# Patient Record
Sex: Female | Born: 1935 | Race: White | Hispanic: No | Smoking: Never smoker
Health system: Southern US, Community
[De-identification: ages and names within clinical notes are randomized; demographics above are authoritative.]

## PROBLEM LIST (undated history)

## (undated) DIAGNOSIS — T7840XA Allergy, unspecified, initial encounter: Secondary | ICD-10-CM

## (undated) DIAGNOSIS — I10 Essential (primary) hypertension: Secondary | ICD-10-CM

## (undated) DIAGNOSIS — E8881 Metabolic syndrome: Secondary | ICD-10-CM

## (undated) DIAGNOSIS — E785 Hyperlipidemia, unspecified: Secondary | ICD-10-CM

## (undated) DIAGNOSIS — M810 Age-related osteoporosis without current pathological fracture: Secondary | ICD-10-CM

## (undated) DIAGNOSIS — E559 Vitamin D deficiency, unspecified: Secondary | ICD-10-CM

## (undated) DIAGNOSIS — F5104 Psychophysiologic insomnia: Secondary | ICD-10-CM

## (undated) DIAGNOSIS — B0229 Other postherpetic nervous system involvement: Secondary | ICD-10-CM

## (undated) DIAGNOSIS — K589 Irritable bowel syndrome without diarrhea: Secondary | ICD-10-CM

## (undated) DIAGNOSIS — N393 Stress incontinence (female) (male): Secondary | ICD-10-CM

## (undated) DIAGNOSIS — L819 Disorder of pigmentation, unspecified: Secondary | ICD-10-CM

## (undated) DIAGNOSIS — K900 Celiac disease: Secondary | ICD-10-CM

## (undated) DIAGNOSIS — R002 Palpitations: Secondary | ICD-10-CM

## (undated) HISTORY — PX: APPENDECTOMY: SHX54

## (undated) HISTORY — DX: Stress incontinence (female) (male): N39.3

## (undated) HISTORY — DX: Other postherpetic nervous system involvement: B02.29

## (undated) HISTORY — DX: Vitamin D deficiency, unspecified: E55.9

## (undated) HISTORY — DX: Palpitations: R00.2

## (undated) HISTORY — DX: Irritable bowel syndrome, unspecified: K58.9

## (undated) HISTORY — PX: OTHER SURGICAL HISTORY: SHX169

## (undated) HISTORY — DX: Metabolic syndrome: E88.810

## (undated) HISTORY — DX: Hyperlipidemia, unspecified: E78.5

## (undated) HISTORY — DX: Celiac disease: K90.0

## (undated) HISTORY — DX: Psychophysiologic insomnia: F51.04

## (undated) HISTORY — PX: TUBAL LIGATION: SHX77

## (undated) HISTORY — DX: Metabolic syndrome: E88.81

## (undated) HISTORY — DX: Essential (primary) hypertension: I10

## (undated) HISTORY — DX: Disorder of pigmentation, unspecified: L81.9

## (undated) HISTORY — DX: Age-related osteoporosis without current pathological fracture: M81.0

## (undated) HISTORY — DX: Allergy, unspecified, initial encounter: T78.40XA

---

## 1973-10-18 HISTORY — PX: ABDOMINAL HYSTERECTOMY: SHX81

## 2005-05-03 ENCOUNTER — Ambulatory Visit: Payer: Self-pay | Admitting: Neurology

## 2005-10-18 LAB — HM COLONOSCOPY: HM Colonoscopy: NORMAL

## 2006-01-28 ENCOUNTER — Ambulatory Visit: Payer: Self-pay | Admitting: General Practice

## 2007-02-25 ENCOUNTER — Other Ambulatory Visit: Payer: Self-pay

## 2007-02-25 ENCOUNTER — Ambulatory Visit: Payer: Self-pay | Admitting: Emergency Medicine

## 2007-06-27 ENCOUNTER — Ambulatory Visit: Payer: Self-pay | Admitting: Family Medicine

## 2008-10-16 ENCOUNTER — Ambulatory Visit: Payer: Self-pay | Admitting: Family Medicine

## 2008-10-23 ENCOUNTER — Ambulatory Visit: Payer: Self-pay | Admitting: Family Medicine

## 2009-01-07 ENCOUNTER — Ambulatory Visit: Payer: Self-pay | Admitting: Family Medicine

## 2009-03-31 ENCOUNTER — Ambulatory Visit: Payer: Self-pay | Admitting: Internal Medicine

## 2011-02-11 LAB — HM MAMMOGRAPHY: HM Mammogram: NORMAL

## 2011-03-23 ENCOUNTER — Ambulatory Visit: Payer: Self-pay | Admitting: Family Medicine

## 2011-04-12 ENCOUNTER — Ambulatory Visit: Payer: Self-pay

## 2011-06-18 ENCOUNTER — Emergency Department: Payer: Self-pay | Admitting: Internal Medicine

## 2012-03-08 ENCOUNTER — Ambulatory Visit: Payer: Self-pay | Admitting: Family Medicine

## 2012-03-08 LAB — HM DEXA SCAN

## 2012-09-04 ENCOUNTER — Ambulatory Visit: Payer: Self-pay | Admitting: Family Medicine

## 2012-10-31 IMAGING — CR DG LUMBAR SPINE 2-3V
1 series · 3 of 3 positions shown · non-contrast
Comparison: none

REASON FOR EXAM: thoracic pain and lumbago
COMMENTS:

PROCEDURE:     KDR - KDXR LUMBAR SPINE AP AND LATERAL  - March 23, 2011  [DATE]
RESULT:     Comparison: None

[Series 1: view not recorded · 0.17mm/px · 3 of 3 slices shown]
[im 1/3]
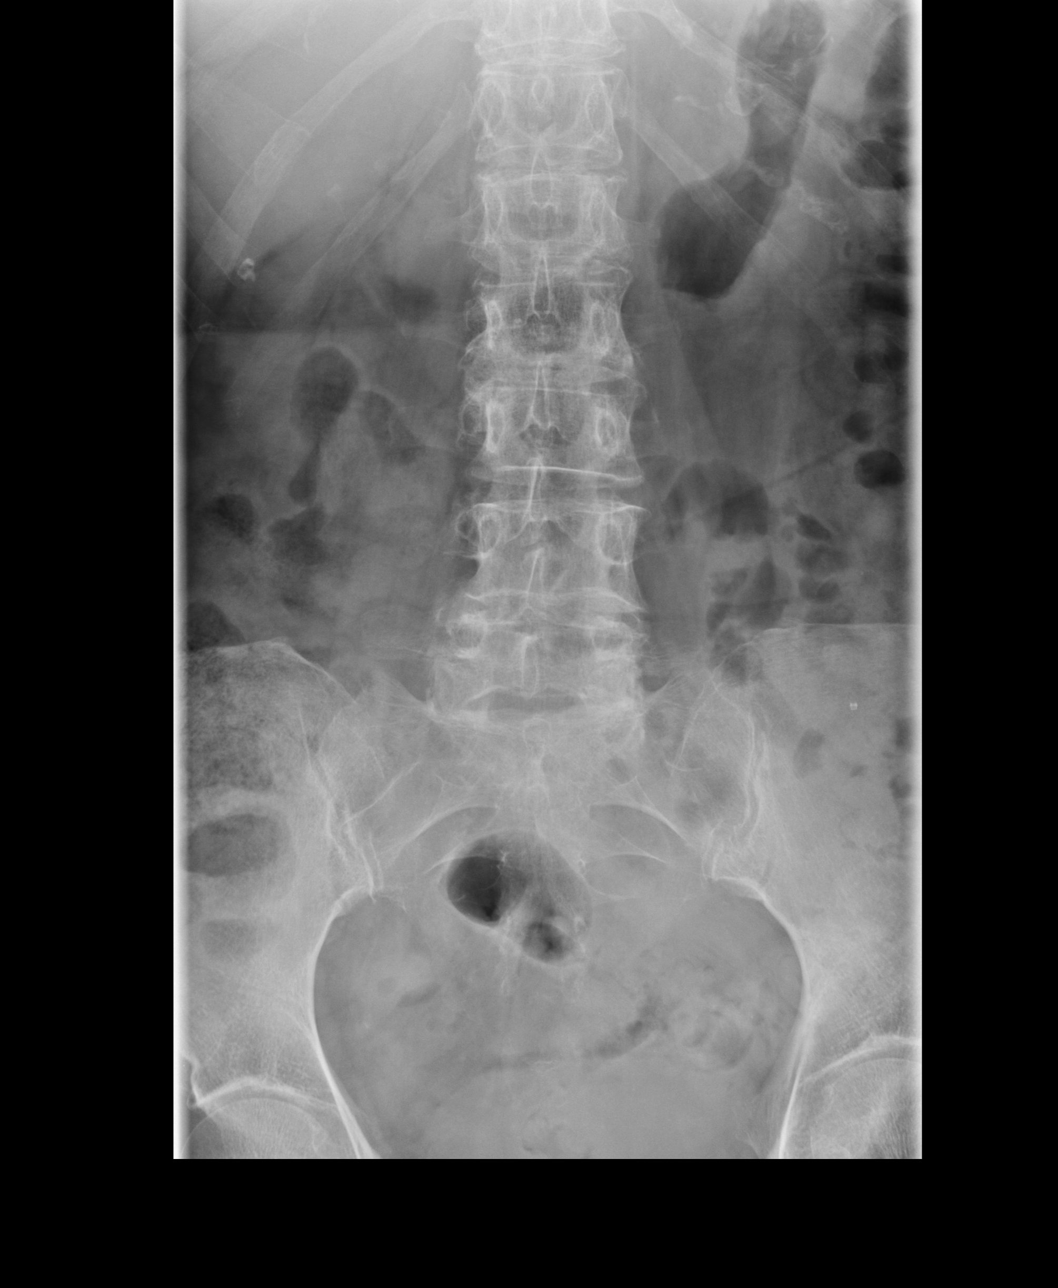
[im 2/3]
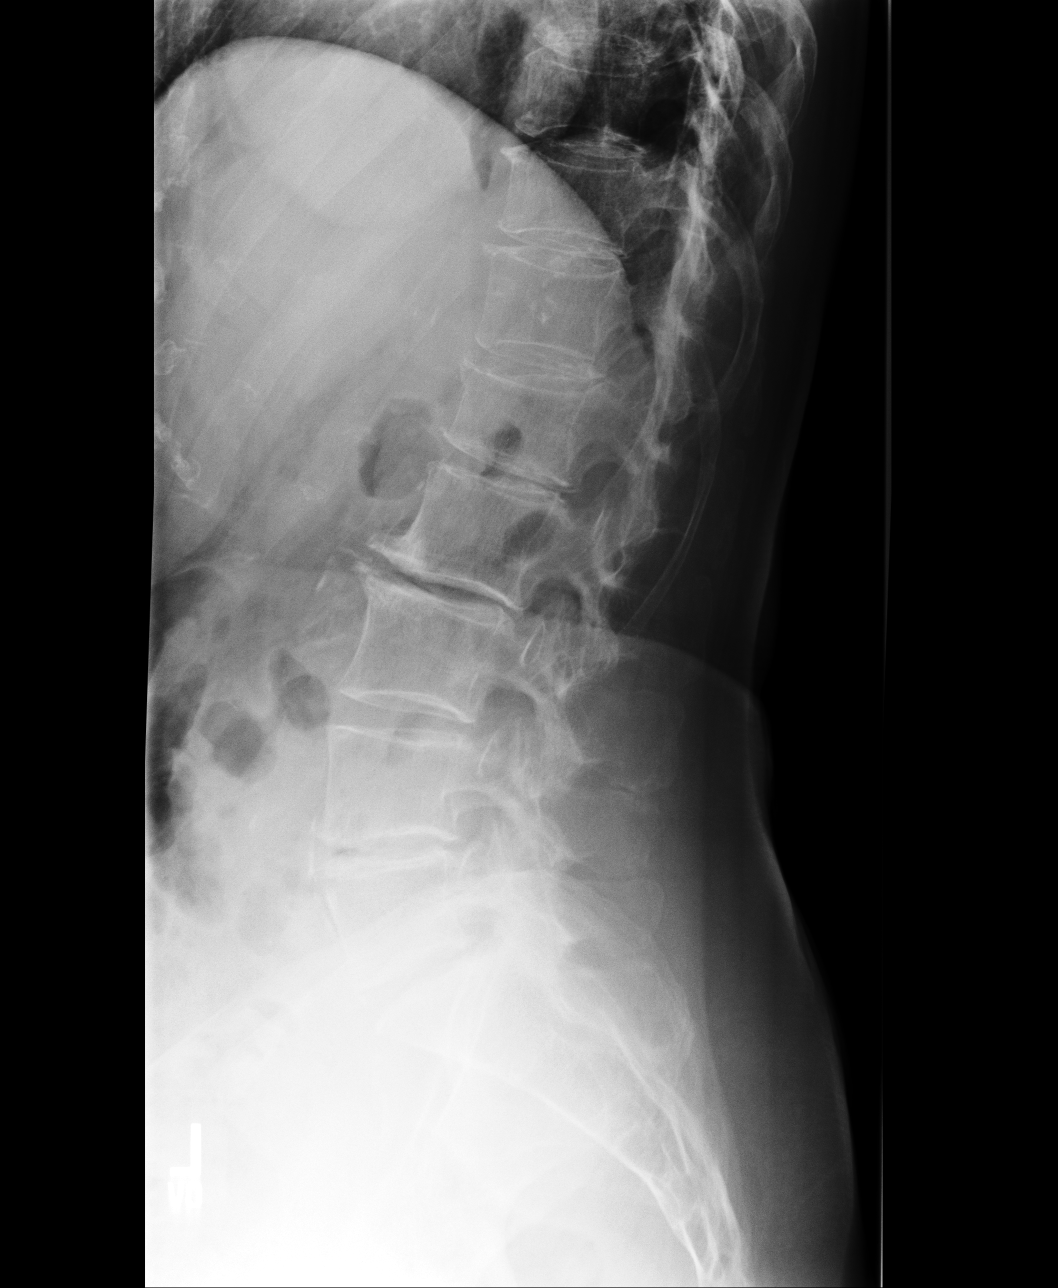
[im 3/3]
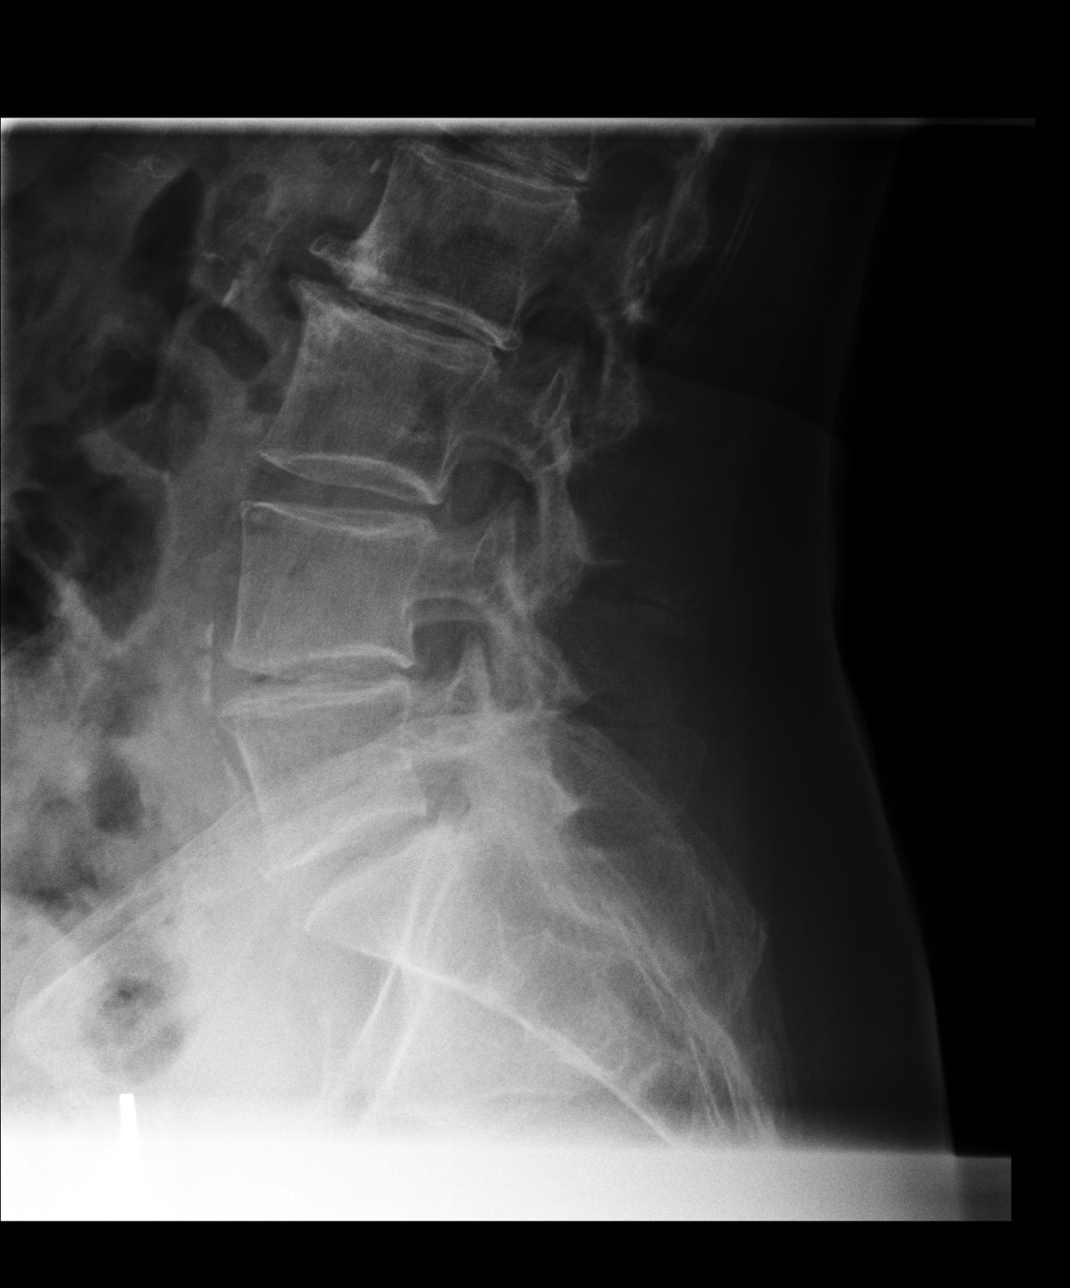

[3 of 3 positions shown; findings below may reference images not displayed]

FINDINGS: AP and lateral views of the lumbar spine and a coned down view of the
lumbosacral junction are provided.

There are 5 nonrib bearing lumbar-type vertebral bodies. There is a mild
anterior vertebral body compression fracture of the L1 vertebral body which
is of indeterminate age. The remainder the vertebral body heights are
maintained. The alignment is anatomic. There is no spondylolysis. There is
no acute fracture or static listhesis. There is degenerative disc disease of
the lumbar spine.

The SI joints are unremarkable.
IMPRESSION: 1. Age indeterminate L1 vertebral body compression fracture.

## 2013-09-11 LAB — HEMOGLOBIN A1C: HEMOGLOBIN A1C: 5.9 % (ref 4.0–6.0)

## 2013-09-11 LAB — LIPID PANEL
Cholesterol: 216 mg/dL — AB (ref 0–200)
HDL: 53 mg/dL (ref 35–70)
LDL CALC: 132 mg/dL
Triglycerides: 157 mg/dL (ref 40–160)

## 2015-03-25 ENCOUNTER — Other Ambulatory Visit: Payer: Self-pay | Admitting: Family Medicine

## 2015-05-14 ENCOUNTER — Encounter: Payer: Self-pay | Admitting: Family Medicine

## 2015-05-14 ENCOUNTER — Ambulatory Visit (INDEPENDENT_AMBULATORY_CARE_PROVIDER_SITE_OTHER): Payer: Medicare Other | Admitting: Family Medicine

## 2015-05-14 VITALS — BP 112/78 | HR 75 | Temp 98.5°F | Resp 16 | Ht 64.0 in | Wt 153.2 lb

## 2015-05-14 DIAGNOSIS — H6503 Acute serous otitis media, bilateral: Secondary | ICD-10-CM | POA: Diagnosis not present

## 2015-05-14 DIAGNOSIS — J0100 Acute maxillary sinusitis, unspecified: Secondary | ICD-10-CM

## 2015-05-14 DIAGNOSIS — J029 Acute pharyngitis, unspecified: Secondary | ICD-10-CM | POA: Insufficient documentation

## 2015-05-14 DIAGNOSIS — H6500 Acute serous otitis media, unspecified ear: Secondary | ICD-10-CM | POA: Insufficient documentation

## 2015-05-14 LAB — POCT RAPID STREP A (OFFICE): RAPID STREP A SCREEN: NEGATIVE

## 2015-05-14 MED ORDER — FLUTICASONE PROPIONATE 50 MCG/ACT NA SUSP
2.0000 | Freq: Every day | NASAL | Status: DC
Start: 2015-05-14 — End: 2015-08-14

## 2015-05-14 MED ORDER — AMOXICILLIN-POT CLAVULANATE 875-125 MG PO TABS: 1.0000 | ORAL_TABLET | Freq: Two times a day (BID) | ORAL | Status: DC

## 2015-05-14 NOTE — Patient Instructions (Signed)
Saline gargles.

## 2015-05-14 NOTE — Progress Notes (Signed)
Name: Alyssa Frazier   MRN: [id number]    DOB: 02/23/1934   Date:05/14/2015       Progress Note  Subjective  Chief Complaint  Chief Complaint  Patient presents with  . Sore Throat    last 3 days    Sore Throat  This is a new problem. The current episode started in the past 7 days. The problem has been gradually worsening. The pain is worse on the left side. There has been no fever. The fever has been present for 5 days or more. The pain is mild. Associated symptoms include congestion, coughing, ear pain and neck pain. Pertinent negatives include no diarrhea, headaches, shortness of breath or vomiting. The treatment provided no relief.  Sinusitis This is a new problem. The current episode started 1 to 4 weeks ago. The problem has been gradually worsening since onset. There has been no fever. The pain is moderate. Associated symptoms include chills, congestion, coughing, ear pain, neck pain, sinus pressure and a sore throat. Pertinent negatives include no headaches or shortness of breath. Past treatments include nothing. The treatment provided no relief.      Past Medical History  Diagnosis Date  . Hypertension   . Osteoporosis   . Hyperlipidemia     History  Substance Use Topics  . Smoking status: Never Smoker   . Smokeless tobacco: Not on file  . Alcohol Use: No     Current outpatient prescriptions:  .  carisoprodol (SOMA) 350 MG tablet, Take 350 mg by mouth 4 (four) times daily as needed for muscle spasms., Disp: , Rfl:  .  meclizine (ANTIVERT) 12.5 MG tablet, Take 12.5 mg by mouth 3 (three) times daily as needed for dizziness., Disp: , Rfl:  .  metoprolol succinate (TOPROL-XL) 50 MG 24 hr tablet, TAKE 1 TABLET DAILY, Disp: , Rfl:  .  traZODone (DESYREL) 50 MG tablet, Take 3 tablets (150 mg total) by mouth at bedtime., Disp: 270 tablet, Rfl: 0 .  valsartan (DIOVAN) 160 MG tablet, Take 160 mg by mouth daily., Disp: , Rfl:   Not on File  Review of Systems  Constitutional:  Positive for chills. Negative for fever and weight loss.  HENT: Positive for congestion, ear pain, sinus pressure and sore throat. Negative for hearing loss and tinnitus.   Eyes: Negative for blurred vision, double vision and redness.  Respiratory: Positive for cough. Negative for hemoptysis, sputum production and shortness of breath.   Cardiovascular: Negative.  Negative for chest pain, palpitations, orthopnea, claudication and leg swelling.  Gastrointestinal: Negative for heartburn, nausea, vomiting, diarrhea, constipation and blood in stool.  Genitourinary: Negative for dysuria, urgency, frequency and hematuria.  Musculoskeletal: Positive for neck pain. Negative for myalgias, back pain, joint pain and falls.  Skin: Negative for itching.  Neurological: Negative for dizziness, tingling, tremors, focal weakness, seizures, loss of consciousness, weakness and headaches.  Endo/Heme/Allergies: Does not bruise/bleed easily.  Psychiatric/Behavioral: Negative for depression and substance abuse. The patient is not nervous/anxious and does not have insomnia.      Objective  Filed Vitals:   05/14/15 1156  BP: 112/78  Pulse: 75  Temp: 98.5 F (36.9 C)  Resp: 16  Height: 5\' 4"  (1.626 m)  Weight: 153 lb 4 oz (69.514 kg)  SpO2: 95%     Physical Exam  Constitutional: She is oriented to person, place, and time and well-developed, well-nourished, and in no distress.  HENT:  Bilateral nasal turbinate swelling and erythema with some mild purulence on the left.  Both TMs show post tympanic fluid with decreased mobility  Eyes: EOM are normal. Pupils are equal, round, and reactive to light.  Neck: Normal range of motion. No thyromegaly present.  Cardiovascular: Normal rate, regular rhythm and normal heart sounds.   No murmur heard. Pulmonary/Chest: Effort normal and breath sounds normal.  Abdominal: Soft. Bowel sounds are normal.  Musculoskeletal: Normal range of motion. She exhibits no edema.   Neurological: She is alert and oriented to person, place, and time. No cranial nerve deficit. Gait normal.  Skin: Skin is warm and dry. No rash noted.  Psychiatric: Mood, memory, affect and judgment normal.      Assessment & Plan  1. Subacute maxillary sinusitis To 3 weeks in duration - amoxicillin-clavulanate (AUGMENTIN) 875-125 MG per tablet; Take 1 tablet by mouth 2 (two) times daily.  Dispense: 20 tablet; Refill: 0 - fluticasone (FLONASE) 50 MCG/ACT nasal spray; Place 2 sprays into both nostrils daily.  Dispense: 16 g; Refill: 6  2. Sore throat Negative strep test - POCT rapid strep A  3. Bilateral acute serous otitis media, recurrence not specified Auto insufflation intermittently  4. Pharyngitis Saline gargles - amoxicillin-clavulanate (AUGMENTIN) 875-125 MG per tablet; Take 1 tablet by mouth 2 (two) times daily.  Dispense: 20 tablet; Refill: 0 - fluticasone (FLONASE) 50 MCG/ACT nasal spray; Place 2 sprays into both nostrils daily.  Dispense: 16 g; Refill: 6

## 2015-05-19 ENCOUNTER — Telehealth: Payer: Self-pay | Admitting: Family Medicine

## 2015-05-19 NOTE — Telephone Encounter (Signed)
Patient is not any better actually she is weaker and have been feverish from time to time. Have tried Aleve and it has not happened. Requesting a different prescription to be called in to [facility].

## 2015-05-20 NOTE — Telephone Encounter (Signed)
Add prednsione 20 mg bid for 5 days

## 2015-05-31 ENCOUNTER — Other Ambulatory Visit: Payer: Self-pay | Admitting: Family Medicine

## 2015-06-23 ENCOUNTER — Other Ambulatory Visit: Payer: Self-pay | Admitting: Family Medicine

## 2015-08-14 ENCOUNTER — Encounter: Payer: Self-pay | Admitting: Family Medicine

## 2015-08-14 ENCOUNTER — Ambulatory Visit (INDEPENDENT_AMBULATORY_CARE_PROVIDER_SITE_OTHER): Payer: Medicare Other | Admitting: Family Medicine

## 2015-08-14 VITALS — BP 122/70 | HR 70 | Temp 97.9°F | Resp 16 | Ht 64.0 in | Wt 154.3 lb

## 2015-08-14 DIAGNOSIS — E663 Overweight: Secondary | ICD-10-CM | POA: Insufficient documentation

## 2015-08-14 DIAGNOSIS — R42 Dizziness and giddiness: Secondary | ICD-10-CM | POA: Insufficient documentation

## 2015-08-14 DIAGNOSIS — I10 Essential (primary) hypertension: Secondary | ICD-10-CM

## 2015-08-14 DIAGNOSIS — E559 Vitamin D deficiency, unspecified: Secondary | ICD-10-CM

## 2015-08-14 DIAGNOSIS — J3089 Other allergic rhinitis: Secondary | ICD-10-CM

## 2015-08-14 DIAGNOSIS — E785 Hyperlipidemia, unspecified: Secondary | ICD-10-CM | POA: Diagnosis not present

## 2015-08-14 DIAGNOSIS — J029 Acute pharyngitis, unspecified: Secondary | ICD-10-CM

## 2015-08-14 DIAGNOSIS — E8881 Metabolic syndrome: Secondary | ICD-10-CM | POA: Insufficient documentation

## 2015-08-14 DIAGNOSIS — M797 Fibromyalgia: Secondary | ICD-10-CM | POA: Insufficient documentation

## 2015-08-14 DIAGNOSIS — Z23 Encounter for immunization: Secondary | ICD-10-CM | POA: Diagnosis not present

## 2015-08-14 DIAGNOSIS — J0100 Acute maxillary sinusitis, unspecified: Secondary | ICD-10-CM | POA: Diagnosis not present

## 2015-08-14 DIAGNOSIS — J309 Allergic rhinitis, unspecified: Secondary | ICD-10-CM

## 2015-08-14 DIAGNOSIS — K5730 Diverticulosis of large intestine without perforation or abscess without bleeding: Secondary | ICD-10-CM | POA: Insufficient documentation

## 2015-08-14 DIAGNOSIS — M546 Pain in thoracic spine: Secondary | ICD-10-CM | POA: Insufficient documentation

## 2015-08-14 DIAGNOSIS — R739 Hyperglycemia, unspecified: Secondary | ICD-10-CM

## 2015-08-14 DIAGNOSIS — M810 Age-related osteoporosis without current pathological fracture: Secondary | ICD-10-CM | POA: Diagnosis not present

## 2015-08-14 DIAGNOSIS — J302 Other seasonal allergic rhinitis: Secondary | ICD-10-CM

## 2015-08-14 DIAGNOSIS — M7060 Trochanteric bursitis, unspecified hip: Secondary | ICD-10-CM | POA: Insufficient documentation

## 2015-08-14 DIAGNOSIS — M1990 Unspecified osteoarthritis, unspecified site: Secondary | ICD-10-CM | POA: Insufficient documentation

## 2015-08-14 DIAGNOSIS — B0229 Other postherpetic nervous system involvement: Secondary | ICD-10-CM | POA: Diagnosis not present

## 2015-08-14 DIAGNOSIS — K900 Celiac disease: Secondary | ICD-10-CM | POA: Insufficient documentation

## 2015-08-14 MED ORDER — FLUTICASONE PROPIONATE 50 MCG/ACT NA SUSP: 2.0000 | Freq: Every day | NASAL | Status: DC

## 2015-08-14 MED ORDER — LIDOCAINE 5 % EX PTCH: 2.0000 | MEDICATED_PATCH | CUTANEOUS | Status: DC

## 2015-08-14 MED ORDER — CARISOPRODOL 350 MG PO TABS: 350.0000 mg | ORAL_TABLET | Freq: Four times a day (QID) | ORAL | Status: DC | PRN

## 2015-08-14 MED ORDER — METOPROLOL SUCCINATE ER 50 MG PO TB24: 25.0000 mg | ORAL_TABLET | Freq: Every day | ORAL | Status: DC

## 2015-08-14 MED ORDER — MECLIZINE HCL 12.5 MG PO TABS: 12.5000 mg | ORAL_TABLET | Freq: Three times a day (TID) | ORAL | Status: DC | PRN

## 2015-08-14 MED ORDER — VALSARTAN-HYDROCHLOROTHIAZIDE 160-12.5 MG PO TABS: 1.0000 | ORAL_TABLET | Freq: Every day | ORAL | Status: DC

## 2015-08-14 MED ORDER — METOPROLOL SUCCINATE ER 50 MG PO TB24: 50.0000 mg | ORAL_TABLET | Freq: Every day | ORAL | Status: DC

## 2015-08-14 NOTE — Progress Notes (Signed)
Name: Alyssa Frazier   MRN: [id number]    DOB: 07/09/1934   Date:08/14/2015       Progress Note  Subjective  Chief Complaint  Chief Complaint  Patient presents with  . Medication Management  . Hypertension    Edema in left ankle in the evening  . Insomnia    Well controlled with medication, sleeps 6 hours nightly  . Spasms    Well controlled with Soma  . Dizziness    Spells are coming very rarely and taking medication as needed    HPI  HTN: patient is taking medication as prescribed, no chest pain, no palpitation.   Insomnia: she takes Trazodone to sleep and it helps her fall asleep and stay asleep.  No snoring  FMS: she has daily muscle spasms, trigger points aches, occasionally joint pain, denies mental fogginess.  She still used biofeedback to control her symptoms. Soma helps and sometimes also uses lidocaine patches  Post-herpetic neuralgia: pain on left thoracic area, uses lidocaine prn, no rashes.   Dizziness: she has intermittent symptoms, usually triggered by allergies, and takes Meclizine prn. Denies  spinning sensation - episodes are rare and described as dizziness. 60 pills of meclizine lasts her over one year  AR: she has noticed some post-nasal drip lately, no nasal congestion, episodes of sneezing and itching at times.   Patient Active Problem List   Diagnosis Date Noted  . Benign essential HTN 08/14/2015  . Celiac disease 08/14/2015  . Diverticulosis of colon 08/14/2015  . Arthritis, degenerative 08/14/2015  . Dyslipidemia 08/14/2015  . Fibromyalgia 08/14/2015  . Overweight (BMI 25.0-29.9) 08/14/2015  . OP (osteoporosis) 08/14/2015  . HZV (herpes zoster virus) post herpetic neuralgia 08/14/2015  . Perennial allergic rhinitis with seasonal variation 08/14/2015  . Vitamin D deficiency 08/14/2015  . Bursitis, trochanteric 08/14/2015  . Metabolic syndrome 08/14/2015  . Vertigo 08/14/2015    Past Surgical History  Procedure Laterality Date  . Abdominal  hysterectomy  1975  . Appendectomy    . Tubal ligation    . Removal of ingrown toe nail      Family History  Problem Relation Age of Onset  . Heart disease Mother     CHF  . Heart attack Father   . CAD Father   . Parkinson's disease Sister     Deceased  . Cancer Sister     Breast Cancer  . Heart attack Brother [phone]    Social History   Social History  . Marital Status: Widowed    Spouse Name: N/A  . Number of Children: N/A  . Years of Education: N/A   Occupational History  . Not on file.   Social History Main Topics  . Smoking status: Never Smoker   . Smokeless tobacco: Never Used  . Alcohol Use: No  . Drug Use: No  . Sexual Activity: Not Currently   Other Topics Concern  . Not on file   Social History Narrative     Current outpatient prescriptions:  .  carisoprodol (SOMA) 350 MG tablet, Take 1 tablet (350 mg total) by mouth 4 (four) times daily as needed for muscle spasms., Disp: 120 tablet, Rfl: 1 .  fluticasone (FLONASE) 50 MCG/ACT nasal spray, Place 2 sprays into both nostrils daily., Disp: 48 g, Rfl: 1 .  lidocaine (LIDODERM) 5 %, Place 2 patches onto the skin daily. Remove & Discard patch within 12 hours or as directed by MD, Disp: 180 patch, Rfl: 1 .  meclizine (ANTIVERT) 12.5  MG tablet, Take 1 tablet (12.5 mg total) by mouth 3 (three) times daily as needed for dizziness., Disp: 60 tablet, Rfl: 0 .  metoprolol succinate (TOPROL-XL) 50 MG 24 hr tablet, Take 1 tablet (50 mg total) by mouth daily. Take with or immediately following a meal., Disp: 90 tablet, Rfl: 1 .  traZODone (DESYREL) 50 MG tablet, TAKE 3 TABLETS (150 MG) AT BEDTIME, Disp: 270 tablet, Rfl: 1 .  valsartan-hydrochlorothiazide (DIOVAN-HCT) 320-25 MG per tablet, TAKE 1 TABLET DAILY, Disp: 90 tablet, Rfl: 1  Allergies  Allergen Reactions  . Aspirin   . Cefaclor   . Ciprofloxacin   . Simvastatin   . Sulfa Antibiotics      ROS  Constitutional: Negative for fever or weight change.   Respiratory: Negative for cough and shortness of breath.   Cardiovascular: Negative for chest pain or palpitations.  Gastrointestinal: Negative for abdominal pain, no bowel changes.  Musculoskeletal: Negative for gait problem or joint swelling.  Skin: Negative for rash.  Neurological: Occasionally  Dizziness, no  headache.  No other specific complaints in a complete review of systems (except as listed in HPI above).  Objective  Filed Vitals:   08/14/15 1113  BP: 122/70  Pulse: 70  Temp: 97.9 F (36.6 C)  TempSrc: Oral  Resp: 16  Height:  (1.626 m)  Weight: 154 lb 4.8 oz (69.99 kg)  SpO2: 93%    Body mass index is 26.47 kg/(m^2).  Physical Exam  Constitutional: Patient appears well-developed and well-nourished. No distress.  HEENT: head atraumatic, normocephalic, pupils equal and reactive to light, neck supple, throat within normal limits Cardiovascular: Normal rate, regular rhythm and normal heart sounds.  No murmur heard. No BLE edema. Pulmonary/Chest: Effort normal and breath sounds normal. No respiratory distress. Abdominal: Soft.  There is no tenderness. Psychiatric: Patient has a normal mood and affect. behavior is normal. Judgment and thought content normal. Muscular Skeletal: trigger point positive, no synovitis   PHQ2/9: Depression screen [facility] 2/9 08/14/2015 05/14/2015  Decreased Interest 0 0  Down, Depressed, Hopeless 0 0  PHQ - 2 Score 0 0    Fall Risk: Fall Risk  08/14/2015 05/14/2015  Falls in the past year? Yes Yes  Number falls in past yr: 1 2 or more  Injury with Fall? No No  Risk for fall due to : - History of fall(s)     Functional Status Survey: Is the patient deaf or have difficulty hearing?: No Does the patient have difficulty seeing, even when wearing glasses/contacts?: Yes (glasses) Does the patient have difficulty concentrating, remembering, or making decisions?: No Does the patient have difficulty walking or climbing stairs?:  No Does the patient have difficulty dressing or bathing?: No Does the patient have difficulty doing errands alone such as visiting a doctor's office or shopping?: No   Assessment & Plan  1. Benign essential HTN  She has been taking half dose of Divoan/HCTZ so we will change dose for next prescription - metoprolol succinate (TOPROL-XL) 50 MG 24 hr tablet; Take 1 tablet (50 mg total) by mouth daily. Take with or immediately following a meal.  Dispense: 90 tablet; Refill: 1 - Comprehensive metabolic panel  2. Needs flu shot  - Flu vaccine HIGH DOSE PF (Fluzone High dose)  3. Dyslipidemia  - Lipid panel  4. OP (osteoporosis)  - Vit D  25 hydroxy (rtn osteoporosis monitoring)  5. Vitamin D deficiency  Recheck level  6. Metabolic syndrome  Check hgbA1C  7. Perennial allergic rhinitis  with seasonal variation  Continue nasal steroid - fluticasone (FLONASE) 50 MCG/ACT nasal spray; Place 2 sprays into both nostrils daily.  Dispense: 48 g; Refill: 1  8. Fibromyalgia  Stable on medication  - carisoprodol (SOMA) 350 MG tablet; Take 1 tablet (350 mg total) by mouth 4 (four) times daily as needed for muscle spasms.  Dispense: 120 tablet; Refill: 1   9. Vertigo  - meclizine (ANTIVERT) 12.5 MG tablet; Take 1 tablet (12.5 mg total) by mouth 3 (three) times daily as needed for dizziness.  Dispense: 60 tablet; Refill: 0  10. HZV (herpes zoster virus) post herpetic neuralgia  - lidocaine (LIDODERM) 5 %; Place 2 patches onto the skin daily. Remove & Discard patch within 12 hours or as directed by MD  Dispense: 180 patch; Refill: 1  13. Hyperglycemia  - Hemoglobin A1c

## 2015-08-20 ENCOUNTER — Telehealth: Payer: Self-pay

## 2015-08-20 NOTE — Telephone Encounter (Signed)
[facility] called, her ins will not cover soma, but will cover tiazanide hcl tabs or caps

## 2015-08-20 NOTE — Telephone Encounter (Signed)
I can change but she may want to call some local pharmacies to find out how much is the cash cost, since she is so sensitive to medications. I will change to Tizanidine if she prefers. Thank you

## 2015-08-21 NOTE — Telephone Encounter (Signed)
Patient notified will call around and  Let us know

## 2015-09-01 ENCOUNTER — Ambulatory Visit
Admission: EM | Admit: 2015-09-01 | Discharge: 2015-09-01 | Disposition: A | Discharge status: Home / Self Care | Payer: Medicare Other | Attending: Family Medicine | Admitting: Family Medicine

## 2015-09-01 ENCOUNTER — Other Ambulatory Visit: Payer: Self-pay | Admitting: Family Medicine

## 2015-09-01 DIAGNOSIS — B029 Zoster without complications: Secondary | ICD-10-CM | POA: Diagnosis not present

## 2015-09-01 MED ORDER — VALACYCLOVIR HCL 1 G PO TABS: 1000.0000 mg | ORAL_TABLET | Freq: Three times a day (TID) | ORAL | Status: DC

## 2015-09-01 NOTE — ED Provider Notes (Signed)
CSN: [id number]     Arrival date & time 09/01/15  1119 History   First MD Initiated Contact with Patient 09/01/15 1243     Chief Complaint  Patient presents with  . Rash   (Consider location/radiation/quality/duration/timing/severity/associated sxs/prior Treatment) HPI Comments: 81 yo female with a 3 days h/o painful, "burning", "stinging" blistery rash on the back of her hand along the lower part of the thumb. States she was working in her garden the day symptoms started but was wearing gloves, covering that area and states was not exposed to poison oak/poison ivy. Denies any itching, fevers, chills, drainage.  The history is provided by the patient.    Past Medical History  Diagnosis Date  . Hypertension   . Osteoporosis   . Hyperlipidemia   . Celiac disease   . Palpitations   . IBS (irritable bowel syndrome)   . Post herpetic neuralgia   . Atypical pigmented skin lesion   . Female stress incontinence   . Allergy   . Chronic insomnia   . Metabolic syndrome   . Vitamin D deficiency    Past Surgical History  Procedure Laterality Date  . Abdominal hysterectomy  1975  . Appendectomy    . Tubal ligation    . Removal of ingrown toe nail     Family History  Problem Relation Age of Onset  . Heart disease Mother     CHF  . Heart attack Father   . CAD Father   . Parkinson's disease Sister     Deceased  . Cancer Sister     Breast Cancer  . Heart attack Brother 37   Social History  Substance Use Topics  . Smoking status: Never Smoker   . Smokeless tobacco: Never Used  . Alcohol Use: No   OB History    No data available     Review of Systems  Allergies  Aspirin; Cefaclor; Ciprofloxacin; Simvastatin; and Sulfa antibiotics  Home Medications   Prior to Admission medications   Medication Sig Start Date End Date Taking? Authorizing Provider  carisoprodol (SOMA) 350 MG tablet Take 1 tablet (350 mg total) by mouth 4 (four) times daily as needed for muscle spasms.  08/14/15  Yes Alba Cory, MD  fluticasone (FLONASE) 50 MCG/ACT nasal spray Place 2 sprays into both nostrils daily. 08/14/15  Yes Alba Cory, MD  lidocaine (LIDODERM) 5 % Place 2 patches onto the skin daily. Remove & Discard patch within 12 hours or as directed by MD 08/14/15  Yes Alba Cory, MD  meclizine (ANTIVERT) 12.5 MG tablet Take 1 tablet (12.5 mg total) by mouth 3 (three) times daily as needed for dizziness. 08/14/15  Yes Alba Cory, MD  metoprolol succinate (TOPROL-XL) 50 MG 24 hr tablet Take 1 tablet (50 mg total) by mouth daily. Take with or immediately following a meal. 08/14/15  Yes Alba Cory, MD  traZODone (DESYREL) 50 MG tablet TAKE 3 TABLETS (150 MG) AT BEDTIME 06/23/15  Yes Alba Cory, MD  valsartan-hydrochlorothiazide (DIOVAN HCT) 160-12.5 MG tablet Take 1 tablet by mouth daily. 08/14/15  Yes Alba Cory, MD  valACYclovir (VALTREX) 1000 MG tablet Take 1 tablet (1,000 mg total) by mouth 3 (three) times daily. 09/01/15   Payton Mccallum, MD   Meds Ordered and Administered this Visit  Medications - No data to display  BP 183/79 mmHg  Pulse 52  Temp(Src) 97.5 F (36.4 C) (Oral)  Resp 16  Ht  (1.651 m)  Wt 150 lb (68.04 kg)  BMI  24.96 kg/m2  SpO2 98% No data found.   Physical Exam  Constitutional: She appears well-developed and well-nourished. No distress.  Skin: Rash noted. She is not diaphoretic.  Vesicular erythematous rash along the right lower (base) thumb in dermatomal pattern; no drainage; hand neurovascularly intact; normal ROM  Nursing note and vitals reviewed.   ED Course  Procedures (including critical care time)  Labs Review Labs Reviewed - No data to display  Imaging Review No results found.   Visual Acuity Review  Right Eye Distance:   Left Eye Distance:   Bilateral Distance:    Right Eye Near:   Left Eye Near:    Bilateral Near:         MDM   1. Shingles    Discharge Medication List as of 09/01/2015   1:10 PM    START taking these medications   Details  valACYclovir (VALTREX) 1000 MG tablet Take 1 tablet (1,000 mg total) by mouth 3 (three) times daily., Starting 09/01/2015, Until Discontinued, Normal      1.diagnosis reviewed with patient 2. rx as per orders above; reviewed possible side effects, interactions, risks and benefits  3. Recommend supportive treatment with otc analgesics prn 4. Follow-up prn if symptoms worsen or don't improve    Payton Mccallumrlando Warrick Llera, MD 09/01/15 1409

## 2015-09-01 NOTE — Telephone Encounter (Signed)
Patient requesting refill. 

## 2015-09-01 NOTE — ED Notes (Signed)
Gardening on Friday and later that night had "stinging" feeling base of right thumb. Now painful rash with blisters

## 2015-09-01 NOTE — Discharge Instructions (Signed)

## 2015-09-02 ENCOUNTER — Telehealth: Payer: Self-pay | Admitting: Family Medicine

## 2015-09-02 DIAGNOSIS — M797 Fibromyalgia: Secondary | ICD-10-CM

## 2015-09-02 MED ORDER — CARISOPRODOL 350 MG PO TABS: 350.0000 mg | ORAL_TABLET | Freq: Four times a day (QID) | ORAL | Status: DC | PRN

## 2015-09-02 NOTE — Telephone Encounter (Signed)
Requesting refill on Carisoprodol (Soma). She know this is expensive but is willing to pay out of pocket. Please send to [facility]

## 2015-09-02 NOTE — Telephone Encounter (Signed)
done

## 2015-09-04 ENCOUNTER — Other Ambulatory Visit: Payer: Self-pay | Admitting: Family Medicine

## 2015-09-04 ENCOUNTER — Telehealth: Payer: Self-pay

## 2015-09-04 NOTE — Telephone Encounter (Signed)
Sent in error already filled

## 2015-09-04 NOTE — Telephone Encounter (Signed)
Pharmacist called from [facility] asking has the patient ever tried and failed Tizanidine? I looked in our old system [facility] and through Epic and did not find any documentation of patient trying to failing this medication. Pharmacist stated Tizanidine is preferred over Carisoprodol, could we switch this medication?

## 2015-09-05 NOTE — Telephone Encounter (Signed)
I thought I had prescribed it for her, but she does not want to change medication, sent prescription

## 2015-10-30 ENCOUNTER — Telehealth: Payer: Self-pay | Admitting: Family Medicine

## 2015-10-30 NOTE — Telephone Encounter (Signed)
Patient keep receiving Fluticasone from her mail order. She is asking that we do not send in another prescription to her pharmacy because she does not need it. She would like to know what does she need to do with all the medication that they have sent that she is not able to use. She said the prescription has not been used and if you would like to have it.

## 2015-10-30 NOTE — Telephone Encounter (Signed)
She can bring it back and we can dispose of the medication for her. Please contact her pharmacy and cancel any other refills on Fluticasone and notify patient. Thank you

## 2015-10-31 NOTE — Telephone Encounter (Signed)
Patient notified

## 2015-11-25 ENCOUNTER — Other Ambulatory Visit: Payer: Self-pay | Admitting: Family Medicine

## 2015-11-25 NOTE — Telephone Encounter (Signed)
Patient requesting refill. 

## 2015-12-16 ENCOUNTER — Ambulatory Visit: Payer: Medicare Other | Admitting: Family Medicine

## 2015-12-18 ENCOUNTER — Other Ambulatory Visit: Payer: Self-pay | Admitting: Family Medicine

## 2015-12-18 NOTE — Telephone Encounter (Signed)
Patient requesting refill. 

## 2015-12-31 ENCOUNTER — Ambulatory Visit: Payer: Medicare Other | Admitting: Family Medicine

## 2016-01-21 ENCOUNTER — Other Ambulatory Visit: Payer: Self-pay | Admitting: Family Medicine

## 2016-01-21 NOTE — Telephone Encounter (Signed)
Patient requesting refill. 

## 2016-02-05 ENCOUNTER — Encounter: Payer: Self-pay | Admitting: Family Medicine

## 2016-02-05 ENCOUNTER — Ambulatory Visit (INDEPENDENT_AMBULATORY_CARE_PROVIDER_SITE_OTHER): Payer: Medicare Other | Admitting: Family Medicine

## 2016-02-05 VITALS — BP 140/76 | HR 77 | Temp 97.5°F | Resp 16 | Wt 158.9 lb

## 2016-02-05 DIAGNOSIS — I10 Essential (primary) hypertension: Secondary | ICD-10-CM | POA: Diagnosis not present

## 2016-02-05 DIAGNOSIS — R42 Dizziness and giddiness: Secondary | ICD-10-CM

## 2016-02-05 DIAGNOSIS — E559 Vitamin D deficiency, unspecified: Secondary | ICD-10-CM | POA: Diagnosis not present

## 2016-02-05 DIAGNOSIS — E8881 Metabolic syndrome: Secondary | ICD-10-CM

## 2016-02-05 DIAGNOSIS — K900 Celiac disease: Secondary | ICD-10-CM

## 2016-02-05 DIAGNOSIS — R739 Hyperglycemia, unspecified: Secondary | ICD-10-CM

## 2016-02-05 DIAGNOSIS — M797 Fibromyalgia: Secondary | ICD-10-CM

## 2016-02-05 DIAGNOSIS — M170 Bilateral primary osteoarthritis of knee: Secondary | ICD-10-CM

## 2016-02-05 DIAGNOSIS — B0229 Other postherpetic nervous system involvement: Secondary | ICD-10-CM | POA: Diagnosis not present

## 2016-02-05 DIAGNOSIS — E785 Hyperlipidemia, unspecified: Secondary | ICD-10-CM

## 2016-02-05 DIAGNOSIS — J3089 Other allergic rhinitis: Secondary | ICD-10-CM

## 2016-02-05 DIAGNOSIS — J309 Allergic rhinitis, unspecified: Secondary | ICD-10-CM

## 2016-02-05 DIAGNOSIS — H269 Unspecified cataract: Secondary | ICD-10-CM

## 2016-02-05 DIAGNOSIS — Z79899 Other long term (current) drug therapy: Secondary | ICD-10-CM

## 2016-02-05 DIAGNOSIS — J302 Other seasonal allergic rhinitis: Secondary | ICD-10-CM

## 2016-02-05 MED ORDER — CARISOPRODOL 350 MG PO TABS: 350.0000 mg | ORAL_TABLET | Freq: Three times a day (TID) | ORAL | Status: DC | PRN

## 2016-02-05 MED ORDER — METOPROLOL SUCCINATE ER 50 MG PO TB24: ORAL_TABLET | ORAL | Status: DC

## 2016-02-05 MED ORDER — VALSARTAN-HYDROCHLOROTHIAZIDE 320-25 MG PO TABS: 1.0000 | ORAL_TABLET | Freq: Every day | ORAL | Status: DC

## 2016-02-05 NOTE — Progress Notes (Signed)
Name: Alyssa Frazier   MRN: [id number]    DOB: 01/18/1934   Date:02/05/2016       Progress Note  Subjective  Chief Complaint  Chief Complaint  Patient presents with  . Hypertension  . Dyslipidemia  . Osteoporosis  . vitamin d deficiency  . metabolic syndrome  . Fibromyalgia  . Allergic Rhinitis     patient stated that she has been coughing and has had some issues with her ears since this flare.  . Hyperglycemia  . post herpetic neuralgia  . Cataract    patient has not it them checked in about 2 years, might need a referral to [facility]    HPI  HTN: patient is taking medication as prescribed, no chest pain, no palpitation.   Insomnia: she takes Trazodone to sleep and it helps her fall asleep and stay asleep. No snoring  FMS: she has daily muscle spasms, trigger points aches, occasionally joint pain, denies mental fogginess. She still used biofeedback to control her symptoms. Soma helps and sometimes also uses lidocaine patches. She uses medication prn.   Pre-diabetes: she is due for labs, she has gained weight, no polyphagia, polydipsia or polyuria.   Post-herpetic neuralgia: pain on left thoracic area intermittently , uses lidocaine prn, no rashes. Doing better  Dizziness: she has intermittent symptoms, usually triggered by allergies, and takes Meclizine prn. Denies spinning sensation - episodes are rare and described as dizziness. 60 pills of meclizine lasts her over one year  AR: she has noticed some post-nasal drip lately,  nasal congestion, she has watery eyes and pruritus at times,  occasional episodes of sneezing and itching at times.    Patient Active Problem List   Diagnosis Date Noted  . Benign essential HTN 08/14/2015  . Celiac disease 08/14/2015  . Diverticulosis of colon 08/14/2015  . Arthritis, degenerative 08/14/2015  . Dyslipidemia 08/14/2015  . Fibromyalgia 08/14/2015  . Overweight (BMI 25.0-29.9) 08/14/2015  . OP (osteoporosis) 08/14/2015   . HZV (herpes zoster virus) post herpetic neuralgia 08/14/2015  . Perennial allergic rhinitis with seasonal variation 08/14/2015  . Vitamin D deficiency 08/14/2015  . Bursitis, trochanteric 08/14/2015  . Metabolic syndrome 08/14/2015  . Vertigo 08/14/2015    Past Surgical History  Procedure Laterality Date  . Abdominal hysterectomy  1975  . Appendectomy    . Tubal ligation    . Removal of ingrown toe nail      Family History  Problem Relation Age of Onset  . Heart disease Mother     CHF  . Heart attack Father   . CAD Father   . Parkinson's disease Sister     Deceased  . Cancer Sister     Breast Cancer  . Heart attack Brother [phone]    Social History   Social History  . Marital Status: Widowed    Spouse Name: N/A  . Number of Children: N/A  . Years of Education: N/A   Occupational History  . Not on file.   Social History Main Topics  . Smoking status: Never Smoker   . Smokeless tobacco: Never Used  . Alcohol Use: No  . Drug Use: No  . Sexual Activity: Not Currently   Other Topics Concern  . Not on file   Social History Narrative     Current outpatient prescriptions:  .  carboxymethylcellulose (REFRESH TEARS) 0.5 % SOLN, 1 drop 3 (three) times daily as needed., Disp: , Rfl:  .  carisoprodol (SOMA) 350 MG tablet, Take 1  tablet (350 mg total) by mouth 3 (three) times daily as needed for muscle spasms., Disp: 90 tablet, Rfl: 0 .  fluticasone (FLONASE) 50 MCG/ACT nasal spray, USE 2 SPRAYS IN EACH NOSTRIL DAILY, Disp: 48 g, Rfl: 0 .  lidocaine (LIDODERM) 5 %, Place 2 patches onto the skin daily. Remove & Discard patch within 12 hours or as directed by MD, Disp: 180 patch, Rfl: 1 .  Liniments (SALONPAS PAIN RELIEF PATCH EX), Apply topically., Disp: , Rfl:  .  meclizine (ANTIVERT) 12.5 MG tablet, Take 1 tablet (12.5 mg total) by mouth 3 (three) times daily as needed for dizziness., Disp: 60 tablet, Rfl: 0 .  metoprolol succinate (TOPROL-XL) 50 MG 24 hr tablet, TAKE  ONE (1) TABLET BY MOUTH EVERY DAY, Disp: 90 tablet, Rfl: 1 .  traZODone (DESYREL) 50 MG tablet, TAKE 3 TABLETS (150 MG) AT BEDTIME, Disp: 270 tablet, Rfl: 1 .  valsartan-hydrochlorothiazide (DIOVAN-HCT) 320-25 MG tablet, Take 1 tablet by mouth daily., Disp: 90 tablet, Rfl: 1  Allergies  Allergen Reactions  . Aspirin   . Cefaclor   . Ciprofloxacin   . Simvastatin   . Sulfa Antibiotics      ROS  Constitutional: Negative for fever, positive for  weight change.  Respiratory: Negative for cough and shortness of breath.   Cardiovascular: Negative for chest pain or palpitations.  Gastrointestinal: Negative for abdominal pain, no bowel changes.  Musculoskeletal: Negative for gait problem or joint swelling.  Skin: Negative for rash.  Neurological: Positive  for intermittent dizziness no headache.  No other specific complaints in a complete review of systems (except as listed in HPI above).  Objective  Filed Vitals:   02/05/16 1046  BP: 140/76  Pulse: 77  Temp: 97.5 F (36.4 C)  TempSrc: Oral  Resp: 16  Weight: 158 lb 14.4 oz (72.077 kg)  SpO2: 93%    Body mass index is 26.44 kg/(m^2).  Physical Exam  Constitutional: Patient appears well-developed and well-nourished. Obese  No distress.  HEENT: head atraumatic, normocephalic, pupils equal and reactive to light,  neck supple, throat within normal limits Cardiovascular: Normal rate, regular rhythm and normal heart sounds.  No murmur heard. No BLE edema. Pulmonary/Chest: Effort normal and breath sounds normal. No respiratory distress. Abdominal: Soft.  There is no tenderness. Psychiatric: Patient has a normal mood and affect. behavior is normal. Judgment and thought content normal.  PHQ2/9: Depression screen [facility] 2/9 02/05/2016 08/14/2015 05/14/2015  Decreased Interest 0 0 0  Down, Depressed, Hopeless 0 0 0  PHQ - 2 Score 0 0 0    Fall Risk: Fall Risk  02/05/2016 08/14/2015 05/14/2015  Falls in the past year? Yes Yes Yes   Number falls in past yr: or more  Injury with Fall? No No No  Risk for fall due to : - - History of fall(s)     Functional Status Survey: Is the patient deaf or have difficulty hearing?: Yes (lately patient has had some issues.) Does the patient have difficulty seeing, even when wearing glasses/contacts?: Yes (patient has cataracts and stated that she needs to have them checked.) Does the patient have difficulty concentrating, remembering, or making decisions?: No Does the patient have difficulty walking or climbing stairs?: No Does the patient have difficulty dressing or bathing?: No Does the patient have difficulty doing errands alone such as visiting a doctor's office or shopping?: No    Assessment & Plan  1. Fibromyalgia  She was having severe symptoms during the winter  but is doing better now - carisoprodol (SOMA) 350 MG tablet; Take 1 tablet (350 mg total) by mouth 3 (three) times daily as needed for muscle spasms.  Dispense: 90 tablet; Refill: 0  2. Benign essential HTN  Is at goal, no side effects - metoprolol succinate (TOPROL-XL) 50 MG 24 hr tablet; TAKE ONE (1) TABLET BY MOUTH EVERY DAY  Dispense: 90 tablet; Refill: 1 - valsartan-hydrochlorothiazide (DIOVAN-HCT) 320-25 MG tablet; Take 1 tablet by mouth daily.  Dispense: 90 tablet; Refill: 1 - Comprehensive metabolic panel - CBC with Differential/Platelet  3. Metabolic syndrome  Recheck labs  4. Perennial allergic rhinitis with seasonal variation  Continue medication, may add otc Loratadine  5. Dyslipidemia  Does not want to take statins, never comes fasting, we will not longer check lipid  6. Vertigo  Stable, taking prn medication   7. Primary osteoarthritis of both knees  Using otc capsacin,   8. HZV (herpes zoster virus) post herpetic neuralgia  Doing better  9. Hyperglycemia  - Hemoglobin A1c  10. Long-term use of high-risk medication  - Comprehensive metabolic panel - CBC with  Differential/Platelet  11. Vitamin D deficiency  - VITAMIN D 25 Hydroxy (Vit-D Deficiency, Fractures)  12. Celiac disease  - Vitamin B12  13. Cataract  - Ambulatory referral to Ophthalmology

## 2016-02-06 LAB — CBC WITH DIFFERENTIAL/PLATELET
BASOS ABS: 0 10*3/uL (ref 0.0–0.2)
Basos: 0 %
EOS (ABSOLUTE): 0.1 10*3/uL (ref 0.0–0.4)
Eos: 1 %
HEMOGLOBIN: 13.5 g/dL (ref 11.1–15.9)
Hematocrit: 40.1 % (ref 34.0–46.6)
Immature Grans (Abs): 0 10*3/uL (ref 0.0–0.1)
Immature Granulocytes: 0 %
LYMPHS ABS: 4 10*3/uL — AB (ref 0.7–3.1)
Lymphs: 45 %
MCH: 30.3 pg (ref 26.6–33.0)
MCHC: 33.7 g/dL (ref 31.5–35.7)
MCV: 90 fL (ref 79–97)
MONOCYTES: 6 %
MONOS ABS: 0.6 10*3/uL (ref 0.1–0.9)
Neutrophils Absolute: 4.1 10*3/uL (ref 1.4–7.0)
Neutrophils: 48 %
Platelets: 282 10*3/uL (ref 150–379)
RBC: 4.45 x10E6/uL (ref 3.77–5.28)
RDW: 14 % (ref 12.3–15.4)
WBC: 8.8 10*3/uL (ref 3.4–10.8)

## 2016-02-06 LAB — COMPREHENSIVE METABOLIC PANEL
A/G RATIO: 1.3 (ref 1.2–2.2)
ALT: 21 IU/L (ref 0–32)
AST: 24 IU/L (ref 0–40)
Albumin: 4.3 g/dL (ref 3.5–4.7)
Alkaline Phosphatase: 49 IU/L (ref 39–117)
BUN/Creatinine Ratio: 12 (ref 12–28)
BUN: 10 mg/dL (ref 8–27)
Bilirubin Total: 0.4 mg/dL (ref 0.0–1.2)
CO2: 30 mmol/L — AB (ref 18–29)
CREATININE: 0.82 mg/dL (ref 0.57–1.00)
Calcium: 9.8 mg/dL (ref 8.7–10.3)
Chloride: 90 mmol/L — ABNORMAL LOW (ref 96–106)
GFR calc Af Amer: 78 mL/min/{1.73_m2} (ref >59)
GFR, EST NON AFRICAN AMERICAN: 68 mL/min/{1.73_m2} (ref >59)
GLUCOSE: 103 mg/dL — AB (ref 65–99)
Globulin, Total: 3.3 g/dL (ref 1.5–4.5)
POTASSIUM: 4.2 mmol/L (ref 3.5–5.2)
Sodium: 136 mmol/L (ref 134–144)
Total Protein: 7.6 g/dL (ref 6.0–8.5)

## 2016-02-06 LAB — VITAMIN D 25 HYDROXY (VIT D DEFICIENCY, FRACTURES): Vit D, 25-Hydroxy: 19.4 ng/mL — ABNORMAL LOW (ref 30.0–100.0)

## 2016-02-06 LAB — VITAMIN B12: VITAMIN B 12: 450 pg/mL (ref 211–946)

## 2016-02-06 LAB — HEMOGLOBIN A1C
ESTIMATED AVERAGE GLUCOSE: 126 mg/dL
HEMOGLOBIN A1C: 6 % — AB (ref 4.8–5.6)

## 2016-02-08 ENCOUNTER — Other Ambulatory Visit: Payer: Self-pay | Admitting: Family Medicine

## 2016-02-08 MED ORDER — VITAMIN D (ERGOCALCIFEROL) 1.25 MG (50000 UNIT) PO CAPS: 50000.0000 [IU] | ORAL_CAPSULE | ORAL | Status: DC

## 2016-02-09 ENCOUNTER — Telehealth: Payer: Self-pay | Admitting: Family Medicine

## 2016-02-09 NOTE — Telephone Encounter (Signed)
Patient found out by mail order pharmacy that the vitamin d is not covered under insurance mail order and would like to know if it is possible for you to prescribe something different. And that she can pick it up at [facility].

## 2016-02-10 NOTE — Telephone Encounter (Signed)
She can try 2000 units of otc vitamin D

## 2016-02-18 ENCOUNTER — Other Ambulatory Visit: Payer: Self-pay

## 2016-02-18 DIAGNOSIS — I10 Essential (primary) hypertension: Secondary | ICD-10-CM

## 2016-02-18 MED ORDER — METOPROLOL SUCCINATE ER 50 MG PO TB24: ORAL_TABLET | ORAL | Status: DC

## 2016-02-18 NOTE — Telephone Encounter (Signed)
Patient requesting refill. 

## 2016-03-10 ENCOUNTER — Other Ambulatory Visit: Payer: Self-pay

## 2016-03-10 DIAGNOSIS — M797 Fibromyalgia: Secondary | ICD-10-CM

## 2016-03-10 NOTE — Telephone Encounter (Signed)
Refill request was sent to Dr. Krichna Sowles for approval and submission.  

## 2016-03-11 MED ORDER — CARISOPRODOL 350 MG PO TABS: 350.0000 mg | ORAL_TABLET | Freq: Three times a day (TID) | ORAL | Status: DC | PRN

## 2016-04-20 ENCOUNTER — Other Ambulatory Visit: Payer: Self-pay | Admitting: Family Medicine

## 2016-05-25 ENCOUNTER — Other Ambulatory Visit: Payer: Self-pay | Admitting: Family Medicine

## 2016-05-25 DIAGNOSIS — M797 Fibromyalgia: Secondary | ICD-10-CM

## 2016-06-15 ENCOUNTER — Other Ambulatory Visit: Payer: Self-pay | Admitting: Family Medicine

## 2016-06-15 NOTE — Telephone Encounter (Signed)
Last seen 02/05/16 last filled 12/18/15 #[id number] 1refill

## 2016-07-21 ENCOUNTER — Ambulatory Visit (INDEPENDENT_AMBULATORY_CARE_PROVIDER_SITE_OTHER): Payer: Medicare Other | Admitting: Family Medicine

## 2016-07-21 ENCOUNTER — Encounter: Payer: Self-pay | Admitting: Family Medicine

## 2016-07-21 VITALS — BP 138/74 | HR 69 | Temp 97.6°F | Resp 16 | Ht 65.0 in | Wt 157.4 lb

## 2016-07-21 DIAGNOSIS — L989 Disorder of the skin and subcutaneous tissue, unspecified: Secondary | ICD-10-CM | POA: Diagnosis not present

## 2016-07-21 DIAGNOSIS — Z23 Encounter for immunization: Secondary | ICD-10-CM

## 2016-07-21 DIAGNOSIS — E8881 Metabolic syndrome: Secondary | ICD-10-CM | POA: Diagnosis not present

## 2016-07-21 DIAGNOSIS — M797 Fibromyalgia: Secondary | ICD-10-CM

## 2016-07-21 DIAGNOSIS — M810 Age-related osteoporosis without current pathological fracture: Secondary | ICD-10-CM

## 2016-07-21 DIAGNOSIS — E559 Vitamin D deficiency, unspecified: Secondary | ICD-10-CM | POA: Diagnosis not present

## 2016-07-21 DIAGNOSIS — I10 Essential (primary) hypertension: Secondary | ICD-10-CM

## 2016-07-21 DIAGNOSIS — E785 Hyperlipidemia, unspecified: Secondary | ICD-10-CM

## 2016-07-21 MED ORDER — METOPROLOL SUCCINATE ER 50 MG PO TB24: ORAL_TABLET | ORAL | 1 refills | Status: DC

## 2016-07-21 MED ORDER — VALSARTAN-HYDROCHLOROTHIAZIDE 320-25 MG PO TABS: 1.0000 | ORAL_TABLET | Freq: Every day | ORAL | 1 refills | Status: DC

## 2016-07-21 MED ORDER — VITAMIN D (ERGOCALCIFEROL) 1.25 MG (50000 UNIT) PO CAPS: 50000.0000 [IU] | ORAL_CAPSULE | ORAL | 0 refills | Status: DC

## 2016-07-21 NOTE — Progress Notes (Signed)
Name: Alyssa Frazier   MRN: [id number]    DOB: 08/21/1934   Date:07/21/2016       Progress Note  Subjective  Chief Complaint  Chief Complaint  Patient presents with  . Medication Refill    6 month F/U  . Hypertension    Swelling in bilateral legs and ankles  . Insomnia    Sleeps about 7 hours nightly  . Fibromyalgia    Unchanged.   . Allergic Rhinitis     Has been worsen since weather change. 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     HPI  HTN: patient is taking medication as prescribed, no chest pain, occasionally has palpitation - usually after drinking caffeine.   Insomnia: she takes Trazodone to sleep and it helps her fall asleep and stay asleep. No snoring. She wakes up feeling rested  FMS: she has daily muscle spasms, trigger points aches, occasionally joint pain, denies mental fogginess. She still used biofeedback to control her symptoms. Soma helps and sometimes also uses lidocaine patches. She uses medication prn and she has enough refills at this time  Pre-diabetes: last hgbA1C was 6.0% , no weight gain,  no polyphagia, polydipsia or polyuria.   Post-herpetic neuralgia: pain on left thoracic area intermittently , uses lidocaine prn, no rashes. Doing better  Dizziness: she has intermittent symptoms, usually triggered by allergies, and takes Meclizine prn. Denies spinning sensation - episodes are rare and described as dizziness. 60 pills of meclizine lasts her over one year  AR: she has noticed some post-nasal drip lately,  nasal congestion, she has watery eyes aoccasional episodes of sneezing and itching at times.   [place], a few weeks ago she thought she got bit by an insect because she noticed a sore on left side of face that quickly turned into a warty like lesion, it seems to be growing in size  Patient Active Problem List   Diagnosis Date Noted  . Benign essential HTN  08/14/2015  . Celiac disease 08/14/2015  . Diverticulosis of colon 08/14/2015  . Arthritis, degenerative 08/14/2015  . Dyslipidemia 08/14/2015  . Fibromyalgia 08/14/2015  . Overweight (BMI 25.0-29.9) 08/14/2015  . OP (osteoporosis) 08/14/2015  . HZV (herpes zoster virus) post herpetic neuralgia 08/14/2015  . Perennial allergic rhinitis with seasonal variation 08/14/2015  . Vitamin D deficiency 08/14/2015  . Bursitis, trochanteric 08/14/2015  . Metabolic syndrome 08/14/2015  . Vertigo 08/14/2015    Past Surgical History:  Procedure Laterality Date  . ABDOMINAL HYSTERECTOMY  1975  . APPENDECTOMY    . removal of ingrown toe nail    . TUBAL LIGATION      Family History  Problem Relation Age of Onset  . Heart disease Mother     CHF  . Heart attack Father   . CAD Father   . Parkinson's disease Sister     Deceased  . Cancer Sister     Breast Cancer  . Heart attack Brother [phone]    Social History   Social History  . Marital status: Widowed    Spouse name: N/A  . Number of children: N/A  . Years of education: N/A   Occupational History  . Not on file.   Social History Main Topics  . Smoking status: Never Smoker  . Smokeless tobacco: Never Used  . Alcohol use No  . Drug use: No  . Sexual activity: Not Currently  Other Topics Concern  . Not on file   Social History Narrative  . No narrative on file     Current Outpatient Prescriptions:  .  carboxymethylcellulose (REFRESH TEARS) 0.5 % SOLN, 1 drop 3 (three) times daily as needed., Disp: , Rfl:  .  carisoprodol (SOMA) 350 MG tablet, TAKE ONE TABLET BY MOUTH THREE TIMES DAILY AS NEEDED FOR MUSCLE SPASMS, Disp: 90 tablet, Rfl: 1 .  fluticasone (FLONASE) 50 MCG/ACT nasal spray, USE 2 SPRAYS IN EACH NOSTRIL DAILY, Disp: 48 g, Rfl: 1 .  lidocaine (LIDODERM) 5 %, Place 2 patches onto the skin daily. Remove & Discard patch within 12 hours or as directed by MD, Disp: 180 patch, Rfl: 1 .  Liniments (SALONPAS PAIN RELIEF  PATCH EX), Apply topically., Disp: , Rfl:  .  meclizine (ANTIVERT) 12.5 MG tablet, Take 1 tablet (12.5 mg total) by mouth 3 (three) times daily as needed for dizziness., Disp: 60 tablet, Rfl: 0 .  metoprolol succinate (TOPROL-XL) 50 MG 24 hr tablet, TAKE ONE (1) TABLET BY MOUTH EVERY DAY, Disp: 90 tablet, Rfl: 1 .  traZODone (DESYREL) 50 MG tablet, TAKE 3 TABLETS (150 MG) AT BEDTIME, Disp: 270 tablet, Rfl: 1 .  valsartan-hydrochlorothiazide (DIOVAN-HCT) 320-25 MG tablet, Take 1 tablet by mouth daily., Disp: 90 tablet, Rfl: 1  Allergies  Allergen Reactions  . Aspirin   . Cefaclor   . Ciprofloxacin   . Simvastatin   . Sulfa Antibiotics      ROS  Constitutional: Negative for fever or weight change.  Respiratory: Negative for cough and shortness of breath.   Cardiovascular: Negative for chest pain or palpitations.  Gastrointestinal: Negative for abdominal pain, no bowel changes.  Musculoskeletal: Negative for gait problem or joint swelling.  Skin: Negative for rash.  Neurological: Negative for dizziness or headache.  No other specific complaints in a complete review of systems (except as listed in HPI above).  Objective  Vitals:   07/21/16 1019  BP: 138/74  Pulse: 69  Resp: 16  Temp: 97.6 F (36.4 C)  TempSrc: Oral  SpO2: 94%  Weight: 157 lb 6.4 oz (71.4 kg)  Height: 5\' 5"  (1.651 m)    Body mass index is 26.19 kg/m.  Physical Exam  Constitutional: Patient appears well-developed and well-nourished. Overweight   No distress.  HEENT: head atraumatic, normocephalic, pupils equal and reactive to light,  neck supple, throat within normal limits Cardiovascular: Normal rate, regular rhythm and normal heart sounds.  No murmur heard. Trace  BLE edema. Pulmonary/Chest: Effort normal and breath sounds normal. No respiratory distress. Abdominal: Soft.  There is no tenderness. Psychiatric: Patient has a normal mood and affect. behavior is normal. Judgment and thought content  normal. Skin: wart like lesion on left cheek Muscular Skeletal: trigger points positive  PHQ2/9: Depression screen [facility] 2/9 07/21/2016 02/05/2016 08/14/2015 05/14/2015  Decreased Interest 0 0 0 0  Down, Depressed, Hopeless 0 0 0 0  PHQ - 2 Score 0 0 0 0     Fall Risk: Fall Risk  07/21/2016 02/05/2016 08/14/2015 05/14/2015  Falls in the past year? No Yes Yes Yes  Number falls in past yr: - 1 1 2  or more  Injury with Fall? - No No No  Risk for fall due to : - - - History of fall(s)    Functional Status Survey: Is the patient deaf or have difficulty hearing?: No Does the patient have difficulty seeing, even when wearing glasses/contacts?: No Does the patient have difficulty concentrating, remembering,  or making decisions?: No Does the patient have difficulty walking or climbing stairs?: No Does the patient have difficulty dressing or bathing?: No Does the patient have difficulty doing errands alone such as visiting a doctor's office or shopping?: Yes (Patient does not drive)    Assessment & Plan  1. Fibromyalgia  Continue current medication   2. Needs flu shot  - Flu vaccine HIGH DOSE PF (Fluzone High dose)  3. Benign essential HTN  - metoprolol succinate (TOPROL-XL) 50 MG 24 hr tablet; TAKE ONE (1) TABLET BY MOUTH EVERY DAY  Dispense: 90 tablet; Refill: 1 - valsartan-hydrochlorothiazide (DIOVAN-HCT) 320-25 MG tablet; Take 1 tablet by mouth daily.  Dispense: 90 tablet; Refill: 1  4. Metabolic syndrome  On life style modification   5. Dyslipidemia  Refuses medication  6. Vitamin D deficiency  - Vitamin D, Ergocalciferol, (DRISDOL) 50000 units CAPS capsule; Take 1 capsule (50,000 Units total) by mouth every 7 (seven) days.  Dispense: 12 capsule; Refill: 0  7. Age related osteoporosis, unspecified pathological fracture presence  We will resume vitamin D rx, she states over the counter causes diarrhea  8. Skin lesion of face  - Ambulatory referral to Dermatology

## 2016-08-06 ENCOUNTER — Ambulatory Visit: Payer: Medicare Other | Admitting: Family Medicine

## 2016-08-19 ENCOUNTER — Other Ambulatory Visit: Payer: Self-pay | Admitting: Family Medicine

## 2016-08-19 DIAGNOSIS — R42 Dizziness and giddiness: Secondary | ICD-10-CM

## 2016-08-20 NOTE — Telephone Encounter (Signed)
Patient requesting refill of Meclizine to [facility].

## 2016-10-17 ENCOUNTER — Other Ambulatory Visit: Payer: Self-pay | Admitting: Family Medicine

## 2016-12-12 ENCOUNTER — Other Ambulatory Visit: Payer: Self-pay | Admitting: Family Medicine

## 2017-01-17 ENCOUNTER — Other Ambulatory Visit: Payer: Self-pay | Admitting: Family Medicine

## 2017-01-17 DIAGNOSIS — I10 Essential (primary) hypertension: Secondary | ICD-10-CM

## 2017-01-17 NOTE — Telephone Encounter (Signed)
Patient last seen in October and had a follow up 01/19/17 but due to Dr. Carlynn Purl being out of the office. Had to reschedule appointment, due to family matters patient was unable to schedule and states it might be June before she could come back. Per Dr. Carlynn Purl request only able to give 30 days with 0 refills until patient calls to schedule appointment.

## 2017-01-19 ENCOUNTER — Ambulatory Visit: Payer: Medicare Other | Admitting: Family Medicine

## 2017-01-29 ENCOUNTER — Other Ambulatory Visit: Payer: Self-pay | Admitting: Family Medicine

## 2017-01-29 DIAGNOSIS — I10 Essential (primary) hypertension: Secondary | ICD-10-CM

## 2017-04-12 ENCOUNTER — Other Ambulatory Visit: Payer: Self-pay | Admitting: Family Medicine

## 2017-04-12 NOTE — Telephone Encounter (Signed)
Patient requesting refill of Flonase to [facility].

## 2017-06-10 ENCOUNTER — Other Ambulatory Visit: Payer: Self-pay | Admitting: Family Medicine

## 2017-06-10 NOTE — Telephone Encounter (Signed)
Completed.

## 2017-06-10 NOTE — Telephone Encounter (Signed)
She needs to follow up , last visit October 2018, if out I can send a 30 day supply

## 2017-06-10 NOTE — Telephone Encounter (Signed)
LMOM to inform pt to call and schedule an appt °

## 2017-07-19 ENCOUNTER — Encounter: Payer: Self-pay | Admitting: Family Medicine

## 2017-07-19 ENCOUNTER — Ambulatory Visit (INDEPENDENT_AMBULATORY_CARE_PROVIDER_SITE_OTHER): Payer: Medicare Other | Admitting: Family Medicine

## 2017-07-19 VITALS — BP 118/64 | HR 74 | Temp 98.0°F | Resp 16 | Ht 65.0 in | Wt 157.1 lb

## 2017-07-19 DIAGNOSIS — M810 Age-related osteoporosis without current pathological fracture: Secondary | ICD-10-CM | POA: Diagnosis not present

## 2017-07-19 DIAGNOSIS — J302 Other seasonal allergic rhinitis: Secondary | ICD-10-CM

## 2017-07-19 DIAGNOSIS — E785 Hyperlipidemia, unspecified: Secondary | ICD-10-CM

## 2017-07-19 DIAGNOSIS — R42 Dizziness and giddiness: Secondary | ICD-10-CM

## 2017-07-19 DIAGNOSIS — E8881 Metabolic syndrome: Secondary | ICD-10-CM | POA: Diagnosis not present

## 2017-07-19 DIAGNOSIS — I10 Essential (primary) hypertension: Secondary | ICD-10-CM

## 2017-07-19 DIAGNOSIS — M797 Fibromyalgia: Secondary | ICD-10-CM | POA: Diagnosis not present

## 2017-07-19 DIAGNOSIS — M170 Bilateral primary osteoarthritis of knee: Secondary | ICD-10-CM | POA: Diagnosis not present

## 2017-07-19 DIAGNOSIS — Z23 Encounter for immunization: Secondary | ICD-10-CM | POA: Diagnosis not present

## 2017-07-19 DIAGNOSIS — R739 Hyperglycemia, unspecified: Secondary | ICD-10-CM

## 2017-07-19 DIAGNOSIS — E559 Vitamin D deficiency, unspecified: Secondary | ICD-10-CM

## 2017-07-19 DIAGNOSIS — J3089 Other allergic rhinitis: Secondary | ICD-10-CM

## 2017-07-19 MED ORDER — METOPROLOL SUCCINATE ER 50 MG PO TB24: 50.0000 mg | ORAL_TABLET | Freq: Every evening | ORAL | 1 refills | Status: DC

## 2017-07-19 MED ORDER — VALSARTAN-HYDROCHLOROTHIAZIDE 320-12.5 MG PO TABS: 1.0000 | ORAL_TABLET | Freq: Every day | ORAL | 1 refills | Status: DC

## 2017-07-19 MED ORDER — TRAZODONE HCL 100 MG PO TABS: ORAL_TABLET | ORAL | 1 refills | Status: DC

## 2017-07-19 MED ORDER — CARISOPRODOL 350 MG PO TABS: 350.0000 mg | ORAL_TABLET | Freq: Three times a day (TID) | ORAL | 1 refills | Status: DC

## 2017-07-19 MED ORDER — VITAMIN D (ERGOCALCIFEROL) 1.25 MG (50000 UNIT) PO CAPS: 50000.0000 [IU] | ORAL_CAPSULE | ORAL | 1 refills | Status: DC

## 2017-07-19 NOTE — Progress Notes (Signed)
Name: Alyssa Frazier   MRN: [id number]    DOB: 05-17-1934   Date:07/19/2017       Progress Note  Subjective  Chief Complaint  Chief Complaint  Patient presents with  . Medication Refill    1 year follow up  . Hypertension    Denies any symptoms  . Insomnia    Sleeping well, 6 to 7 hours nightly  . Fibromyalgia  . Allergic Rhinitis     Does not have to take medication due to body fights off allergic reactions on its own    HPI  HTN: patient is taking medication as prescribed, no chest pain, occasionally has palpitation - usually after drinking caffeine. She gets dizzy when she stoops. We will adjust dose of bp medication  Insomnia: she takes Trazodone to sleep and it helps her fall asleep and stay asleep, she has been only taking 100 mg , no longer on  and we will change from 50 mg to 100 mg qpm.. No snoring. She wakes up feeling rested  FMS: she has daily muscle spasms, trigger points aches but not constantly, occasionally joint pain, denies mental fogginess. She still used biofeedback to control her symptoms. Soma helps and sometimes no longer using  lidocaine patches.  Pre-diabetes: last hgbA1C was 6.0% , no weight gain,  no polyphagia, polydipsia or polyuria. We will recheck labs  Post-herpetic neuralgia: pain on left thoracic area intermittently , uses lidocaine prn, no rashes. Resolved   AR: she has noticed some post-nasal drip lately, nasal congestion, she has watery eyes aoccasional episodes of sneezing and itching at times.    Patient Active Problem List   Diagnosis Date Noted  . Benign essential HTN 08/14/2015  . Celiac disease 08/14/2015  . Diverticulosis of colon 08/14/2015  . Arthritis, degenerative 08/14/2015  . Dyslipidemia 08/14/2015  . Fibromyalgia 08/14/2015  . Overweight (BMI 25.0-29.9) 08/14/2015  . OP (osteoporosis) 08/14/2015  . HZV (herpes zoster virus) post herpetic neuralgia 08/14/2015  . Perennial allergic rhinitis with seasonal  variation 08/14/2015  . Vitamin D deficiency 08/14/2015  . Bursitis, trochanteric 08/14/2015  . Metabolic syndrome 08/14/2015  . Vertigo 08/14/2015    Past Surgical History:  Procedure Laterality Date  . ABDOMINAL HYSTERECTOMY  1975  . APPENDECTOMY    . removal of ingrown toe nail    . TUBAL LIGATION      Family History  Problem Relation Age of Onset  . Heart disease Mother        CHF  . Heart attack Father   . CAD Father   . Parkinson's disease Sister        Deceased  . Cancer Sister        Breast Cancer  . Heart attack Brother 89    Social History   Social History  . Marital status: Widowed    Spouse name: N/A  . Number of children: N/A  . Years of education: N/A   Occupational History  . Not on file.   Social History Main Topics  . Smoking status: Never Smoker  . Smokeless tobacco: Never Used  . Alcohol use No  . Drug use: No  . Sexual activity: Not Currently   Other Topics Concern  . Not on file   Social History Narrative  . No narrative on file     Current Outpatient Prescriptions:  .  carboxymethylcellulose (REFRESH TEARS) 0.5 % SOLN, 1 drop 3 (three) times daily as needed., Disp: , Rfl:  .  carisoprodol (SOMA) 350  MG tablet, TAKE ONE TABLET BY MOUTH THREE TIMES DAILY AS NEEDED FOR MUSCLE SPASMS, Disp: 90 tablet, Rfl: 1 .  fluticasone (FLONASE) 50 MCG/ACT nasal spray, USE 2 SPRAYS IN EACH NOSTRIL DAILY, Disp: 48 g, Rfl: 1 .  Liniments (SALONPAS PAIN RELIEF PATCH EX), Apply topically., Disp: , Rfl:  .  metoprolol succinate (TOPROL-XL) 50 MG 24 hr tablet, TAKE 1 TABLET DAILY (WILL NEED A FOLLOW UP APPOINTMENT FOR MEDICATION REFILLS), Disp: 30 tablet, Rfl: 0 .  traZODone (DESYREL) 50 MG tablet, TAKE 3 TABLETS (150 MG) AT BEDTIME, Disp: 270 tablet, Rfl: 1 .  Vitamin D, Ergocalciferol, (DRISDOL) 50000 units CAPS capsule, Take 1 capsule (50,000 Units total) by mouth every 7 (seven) days., Disp: 12 capsule, Rfl: 0  Allergies  Allergen Reactions  .  Aspirin   . Cefaclor   . Ciprofloxacin   . Simvastatin   . Sulfa Antibiotics      ROS  Constitutional: Negative for fever or weight change.  Respiratory: Negative for cough and shortness of breath.   Cardiovascular: Negative for chest pain or palpitations.  Gastrointestinal: Negative for abdominal pain, no bowel changes.  Musculoskeletal: Negative for gait problem or joint swelling.  Skin: Negative for rash.  Neurological: Positive for intermittent dizziness, no  headache.  No other specific complaints in a complete review of systems (except as listed in HPI above).  Objective  Vitals:   07/19/17 1115  BP: 118/64  Pulse: 74  Resp: 16  Temp: 98 F (36.7 C)  TempSrc: Oral  SpO2: 97%  Weight: 157 lb 1.6 oz (71.3 kg)  Height:  (1.651 m)    Body mass index is 26.14 kg/m.  Physical Exam  Constitutional: Patient appears well-developed and well-nourished.  No distress.  HEENT: head atraumatic, normocephalic, pupils equal and reactive to light,  neck supple, throat within normal limits Cardiovascular: Normal rate, regular rhythm and normal heart sounds.  No murmur heard. No BLE edema. Pulmonary/Chest: Effort normal and breath sounds normal. No respiratory distress. Abdominal: Soft.  There is no tenderness. Psychiatric: Patient has a normal mood and affect. behavior is normal. Judgment and thought content normal. Muscular Skeletal:   PHQ2/9: Depression screen [facility] 2/9 07/19/2017 07/21/2016 02/05/2016 08/14/2015 05/14/2015  Decreased Interest 0 0 0 0 0  Down, Depressed, Hopeless 0 0 0 0 0  PHQ - 2 Score 0 0 0 0 0     Fall Risk: Fall Risk  07/19/2017 07/21/2016 02/05/2016 08/14/2015 05/14/2015  Falls in the past year? Yes No Yes Yes Yes  Number falls in past yr: 1 - or more  Injury with Fall? No - No No No  Risk for fall due to : - - - - History of fall(s)     Functional Status Survey: Is the patient deaf or have difficulty hearing?: No Does the patient have  difficulty seeing, even when wearing glasses/contacts?: Yes (Cataracts in both eyes-optometrist is watching them) Does the patient have difficulty concentrating, remembering, or making decisions?: No Does the patient have difficulty walking or climbing stairs?: Yes Does the patient have difficulty dressing or bathing?: No Does the patient have difficulty doing errands alone such as visiting a doctor's office or shopping?: Yes   Assessment & Plan  1. Benign essential HTN  - metoprolol succinate (TOPROL-XL) 50 MG 24 hr tablet; Take 1 tablet (50 mg total) by mouth every evening. Take with or immediately following a meal.  Dispense: 90 tablet; Refill: 1  2. Needs flu shot  -  Flu vaccine HIGH DOSE PF (Fluzone High dose)  3. Fibromyalgia  - carisoprodol (SOMA) 350 MG tablet; Take 1 tablet (350 mg total) by mouth 3 (three) times daily.  Dispense: 90 tablet; Refill: 1  4. Age related osteoporosis, unspecified pathological fracture presence  Continue supplementation   5. Metabolic syndrome   6. Dyslipidemia   7. Perennial allergic rhinitis with seasonal variation   8. Primary osteoarthritis of both knees  stable  9. Hyperglycemia  She is on life style modification   10. Vertigo  resolved  11. Vitamin D deficiency  - Vitamin D, Ergocalciferol, (DRISDOL) 50000 units CAPS capsule; Take 1 capsule (50,000 Units total) by mouth every 7 (seven) days.  Dispense: 12 capsule; Refill: 1

## 2017-07-21 ENCOUNTER — Telehealth: Payer: Self-pay

## 2017-07-21 DIAGNOSIS — M797 Fibromyalgia: Secondary | ICD-10-CM

## 2017-07-21 NOTE — Telephone Encounter (Signed)
Patient pharmacy states carisoprodol is not preferred but do preferred Tizanidine any strength and Tizanidine HCL and strength. Can you please switch to preferred medication and [facility] References # for telephone call is [phone] and phone number [phone].

## 2017-07-22 NOTE — Telephone Encounter (Signed)
Patient would rather stay on Soma and have it sent to a local pharmacy at [facility]

## 2017-07-22 NOTE — Telephone Encounter (Signed)
Patient may want to pay cash, she has been on soma for many years, however I can send Tizainidine if she prefers 2 mg tid prn

## 2017-07-25 MED ORDER — CARISOPRODOL 350 MG PO TABS: 350.0000 mg | ORAL_TABLET | Freq: Three times a day (TID) | ORAL | 1 refills | Status: DC

## 2017-08-16 ENCOUNTER — Telehealth: Payer: Self-pay | Admitting: Family Medicine

## 2017-08-16 DIAGNOSIS — I10 Essential (primary) hypertension: Secondary | ICD-10-CM

## 2017-08-16 NOTE — Telephone Encounter (Signed)
Copied from CRM [id number]. Topic: Quick Communication - See Telephone Encounter >> Aug 16, 2017  2:14 PM Guinevere FerrariMorris, Keyairra Kolinski E, NT wrote: CRM for notification. See Telephone encounter for:  08/16/17. Pt. Needs refill on metoprolol succinate (TOPROL-XL) 50 MG 24 hr tablet. Pt. Said medication is suppose to come in the mail and she is down to one more tablet.

## 2017-08-16 NOTE — Telephone Encounter (Signed)
Called [facility] and they confirmed patient received Metoprolol 50 mg on October 9 with a 90 day supply and has 1 refill remaining. Patient should not be out of medication. Called and left her a message if she did not received his medication for any reason to call [facility] since they received the delivery was made on July 26, 2017.

## 2017-10-09 ENCOUNTER — Other Ambulatory Visit: Payer: Self-pay | Admitting: Family Medicine

## 2017-10-10 NOTE — Telephone Encounter (Signed)
Refill request for general medication: Flonase to [facility].  Last office visit: 07/19/2017  Follow up on 01/17/2018

## 2017-12-27 ENCOUNTER — Other Ambulatory Visit: Payer: Self-pay | Admitting: Family Medicine

## 2017-12-27 ENCOUNTER — Telehealth: Payer: Self-pay | Admitting: Family Medicine

## 2017-12-27 MED ORDER — VALSARTAN-HYDROCHLOROTHIAZIDE 320-12.5 MG PO TABS: 1.0000 | ORAL_TABLET | Freq: Every day | ORAL | 0 refills | Status: DC

## 2017-12-27 NOTE — Telephone Encounter (Signed)
sent 

## 2017-12-27 NOTE — Telephone Encounter (Signed)
Copied from CRM [id number]. Topic: General - Other >> Dec 27, 2017  2:40 PM Cecelia ByarsGreen, Temeka L, RMA wrote: Reason for CRM: Medication refill request for  valsartan-hydrochlorothiazide (DIOVAN-HCT) 320-12.5 MG tablet [facility]

## 2017-12-29 ENCOUNTER — Other Ambulatory Visit: Payer: Self-pay | Admitting: Family Medicine

## 2018-01-15 ENCOUNTER — Other Ambulatory Visit: Payer: Self-pay | Admitting: Family Medicine

## 2018-01-15 DIAGNOSIS — I10 Essential (primary) hypertension: Secondary | ICD-10-CM

## 2018-01-15 NOTE — Telephone Encounter (Signed)
She needs  Follow up, last visit 07/2017 I will send one refill

## 2018-01-16 NOTE — Telephone Encounter (Signed)
Called [phone] @ 9:51am Left voice message informing pt that prescription has been sent to pharmacy but asked her to give the office a call to schedule an appt for additional refills

## 2018-01-17 ENCOUNTER — Ambulatory Visit: Payer: Medicare Other | Admitting: Family Medicine

## 2018-03-09 ENCOUNTER — Other Ambulatory Visit: Payer: Self-pay | Admitting: Family Medicine

## 2018-03-09 DIAGNOSIS — I10 Essential (primary) hypertension: Secondary | ICD-10-CM

## 2018-03-09 NOTE — Telephone Encounter (Signed)
Copied from CRM [id number]. Topic: Quick Communication - Rx Refill/Question >> Mar 09, 2018  4:24 PM Rudi Coco, [place] wrote: Medication: metoprolol succinate (TOPROL-XL) 50 MG 24 hr tablet [[id number]]   Has the patient contacted their pharmacy? yes (Agent: If no, request that the patient contact the pharmacy for the refill.) (Agent: If yes, when and what did the pharmacy advise?)  Preferred Pharmacy (with phone number or street name): [facility] - [place] - [address] [place] [address] Phone: [phone] Fax: [phone]    Agent: Please be advised that RX refills may take up to 3 business days. We ask that you follow-up with your pharmacy.

## 2018-03-10 MED ORDER — METOPROLOL SUCCINATE ER 50 MG PO TB24: ORAL_TABLET | ORAL | 0 refills | Status: DC

## 2018-03-10 NOTE — Telephone Encounter (Signed)
Refill request for Hypertension medication:  Metoprolol 50 mg  Last office visit pertaining to hypertension: 07/19/2017  BP Readings from Last 3 Encounters:  07/19/17 118/64  07/21/16 138/74  02/05/16 140/76     Lab Results  Component Value Date   CREATININE 0.82 02/05/2016   BUN 10 02/05/2016   NA 136 02/05/2016   K 4.2 02/05/2016   CL 90 (L) 02/05/2016   CO2 30 (H) 02/05/2016     Follow-ups on file. None indicated

## 2018-03-10 NOTE — Telephone Encounter (Signed)
She needs follow up, sending 30 days,

## 2018-03-10 NOTE — Telephone Encounter (Signed)
Spoke with patient and informed her that her prescription has been sent to her pharmacy. Informed her that Dr Carlynn Purl is asking that she schedule an appt. Pt will contact us next week once she speak with her ride

## 2018-03-14 ENCOUNTER — Other Ambulatory Visit: Payer: Self-pay | Admitting: Family Medicine

## 2018-03-14 DIAGNOSIS — M797 Fibromyalgia: Secondary | ICD-10-CM

## 2018-03-17 ENCOUNTER — Other Ambulatory Visit: Payer: Self-pay

## 2018-03-17 NOTE — Telephone Encounter (Signed)
Lft message that the patient needs a follow up appt

## 2018-03-17 NOTE — Telephone Encounter (Signed)
She needs follow up

## 2018-03-30 ENCOUNTER — Other Ambulatory Visit: Payer: Self-pay | Admitting: Family Medicine

## 2018-03-31 ENCOUNTER — Other Ambulatory Visit: Payer: Self-pay | Admitting: Family Medicine

## 2018-03-31 DIAGNOSIS — I10 Essential (primary) hypertension: Secondary | ICD-10-CM

## 2018-04-08 ENCOUNTER — Other Ambulatory Visit: Payer: Self-pay | Admitting: Family Medicine

## 2018-04-13 ENCOUNTER — Telehealth: Payer: Self-pay | Admitting: Family Medicine

## 2018-04-13 ENCOUNTER — Other Ambulatory Visit: Payer: Self-pay | Admitting: Family Medicine

## 2018-04-13 DIAGNOSIS — I10 Essential (primary) hypertension: Secondary | ICD-10-CM

## 2018-04-13 NOTE — Telephone Encounter (Unsigned)
Copied from CRM [id number]. Topic: Quick Communication - Rx Refill/Question >> Apr 13, 2018  2:49 PM Gaynelle AduPoole, Shal[place] wrote: Medication: metoprolol succinate (TOPROL-XL) 50 MG 24 hr tablet   Has the patient contacted their pharmacy: no   Preferred Pharmacy :[facility] [place] [address] [phone] (Phone) [phone] (Fax)      Agent: Please be advised that RX refills may take up to 3 business days. We ask that you follow-up with your pharmacy.

## 2018-04-14 NOTE — Telephone Encounter (Signed)
LVM for pt to call the office to schedule an appt. °

## 2018-04-14 NOTE — Telephone Encounter (Signed)
Per Dr. Carlynn PurlSowles patient has not been here since October 2018 and will need an appointment. She has already send in a 30 day supply with instructions to make follow up. Please inform patient we are unable to send any until she has an appointment made and then will send in just enough until she her appointment. Thanks.

## 2018-04-23 ENCOUNTER — Other Ambulatory Visit: Payer: Self-pay | Admitting: Family Medicine

## 2018-04-23 DIAGNOSIS — I10 Essential (primary) hypertension: Secondary | ICD-10-CM

## 2018-06-27 ENCOUNTER — Other Ambulatory Visit: Payer: Self-pay | Admitting: Family Medicine

## 2018-07-13 ENCOUNTER — Telehealth: Payer: Self-pay | Admitting: Family Medicine

## 2018-07-13 DIAGNOSIS — Z748 Other problems related to care provider dependency: Secondary | ICD-10-CM

## 2018-07-13 DIAGNOSIS — I10 Essential (primary) hypertension: Secondary | ICD-10-CM

## 2018-07-13 DIAGNOSIS — E785 Hyperlipidemia, unspecified: Secondary | ICD-10-CM

## 2018-07-16 NOTE — Telephone Encounter (Signed)
Can you please check on her. Pharmacy keeps requesting refills, but I have not seen her in many months. Is she okay? Seeing another provider? She needs follow up

## 2018-07-17 ENCOUNTER — Telehealth: Payer: Self-pay | Admitting: Family Medicine

## 2018-07-17 DIAGNOSIS — I10 Essential (primary) hypertension: Secondary | ICD-10-CM

## 2018-07-17 NOTE — Telephone Encounter (Signed)
I spoke with Alyssa Frazier and she did schedule an appt with Irving Burton for this Thursday. She did say this appt is pending depending on her grandson. I do think this is something that Raynelle Fanning for chronic care can assist her with

## 2018-07-17 NOTE — Telephone Encounter (Signed)
Copied from CRM [id number]. Topic: Quick Communication - See Telephone Encounter >> Jul 17, 2018 11:45 AM Jens Som A wrote: CRM for notification. See Telephone encounter for: 07/17/18. Alyssa Frazier is calling from [facility] regarding metoprolol succinate (TOPROL-XL) 50 MG 24 hr tablet [[id number]]  Patient is out of Med. Pharmist refilled med with 6 tablets.  And filled on Saturday [facility] - [place], [place] - [place], [place] - [address] [place] [address] Phone: [phone] Fax: [phone]

## 2018-07-17 NOTE — Addendum Note (Signed)
Addended by: Cynda Familia on: 07/17/2018 03:21 PM   Modules accepted: Orders

## 2018-07-17 NOTE — Telephone Encounter (Signed)
Copied from CRM [id number]. Topic: Quick Communication - Rx Refill/Question >> Jul 17, 2018 11:49 AM Jens Som A wrote: Medication: metoprolol succinate (TOPROL-XL) 50 MG 24 hr tablet [[id number]]  Has the patient contacted their pharmacy? Yes  (Agent: If no, request that the patient contact the pharmacy for the refill.) (Agent: If yes, when and what did the pharmacy advise?)  Preferred Pharmacy (with phone number or street name): [facility] - [place], [place] - [place] - [address] [place] [address] Phone: [phone] Fax: [phone]    Agent: Please be advised that RX refills may take up to 3 business days. We ask that you follow-up with your pharmacy.

## 2018-07-17 NOTE — Telephone Encounter (Signed)
Spoke with patient and she states she only has her grandson for transportation needs and wanted to wait until next January for an appointment. I reminded her that her last appointment was 07/2017 and is over due for an appointment. Is this a chronic care management problem that Serina Cowper could help [place] with due to her transportation needs?

## 2018-07-18 NOTE — Addendum Note (Signed)
Addended by: Alba Cory F on: 07/18/2018 09:19 PM   Modules accepted: Orders

## 2018-07-20 ENCOUNTER — Ambulatory Visit: Payer: Self-pay | Admitting: Pharmacist

## 2018-07-20 ENCOUNTER — Ambulatory Visit: Payer: Medicare Other | Admitting: Family Medicine

## 2018-07-20 DIAGNOSIS — I10 Essential (primary) hypertension: Secondary | ICD-10-CM

## 2018-07-20 DIAGNOSIS — E785 Hyperlipidemia, unspecified: Secondary | ICD-10-CM

## 2018-07-20 DIAGNOSIS — M810 Age-related osteoporosis without current pathological fracture: Secondary | ICD-10-CM

## 2018-07-20 NOTE — Chronic Care Management (AMB) (Signed)
  Chronic Care Management   Note  07/20/2018 Name: Alyssa Frazier MRN: [id number] DOB: 04-Apr-1934   Alyssa Frazier is a 84 y.o. year old female who sees Steele Sizer, MD for primary care. Dr. Ancil Boozer asked the CCM team to consult the patient for assistance with chronic disease management related to medication management. Referral was placed 07/18/18. Telephone outreach to patient today to introduce CCM services.   Alyssa Frazier agreed that CCM services would be helpful to them.   Plan: Alyssa Frazier cancelled her appointment with Raelyn Ensign NP today because she wasn't feeling well. She will schedule an initial CCM office visit when she reschedules her missed appointment.   Alyssa Frazier was given information about Chronic Care Management services today including:  1. CCM service includes personalized support from designated clinical staff supervised by her physician, including individualized plan of care and coordination with other care providers 2. 24/7 contact phone numbers for assistance for urgent and routine care needs. 3. Service will only be billed when office clinical staff spend 20 minutes or more in a month to coordinate care. 4. Only one practitioner may furnish and bill the service in a calendar month. 5. The patient may stop CCM services at any time (effective at the end of the month) by phone call to the office staff. 6. The patient will be responsible for cost sharing (co-pay) of up to 20% of the service fee (after annual deductible is met).   Patient agreed to services and verbal consent obtained.  Ruben Reason, PharmD Clinical Pharmacist [facility] [phone]

## 2018-07-24 NOTE — Telephone Encounter (Signed)
Called [phone] @ 2:22 asking pt to return call to schedule appt then dr Carlynn Purl will call her in a supply. Pt have the option to see emily.

## 2018-07-24 NOTE — Telephone Encounter (Signed)
She needs to be seen , I will send to local pharmacy one appointment is made

## 2018-07-24 NOTE — Telephone Encounter (Signed)
Pt needs metoprolol #30 w/refills. Pt is no longer going to get medication from mail order

## 2018-07-25 MED ORDER — METOPROLOL SUCCINATE ER 50 MG PO TB24: ORAL_TABLET | ORAL | 0 refills | Status: DC

## 2018-07-25 NOTE — Telephone Encounter (Signed)
Spoke with patient and she will be coming in on Thursday

## 2018-07-25 NOTE — Telephone Encounter (Signed)
Sent 3 more to [facility], she can be seen on Thursday at 11 am.

## 2018-07-25 NOTE — Telephone Encounter (Signed)
Pt called to schedule an appointment so she can get this medication refilled-stating she is only available this Thursday due to transportation. Pt states that she has been out of this medication for a week and has been feeling unwell. Pt would like to know if a refill can be called in and is also requesting Dr. Carlynn Purl to fit her in this Thursday if possible. Please advise.

## 2018-07-26 ENCOUNTER — Other Ambulatory Visit: Payer: Self-pay

## 2018-07-26 DIAGNOSIS — I10 Essential (primary) hypertension: Secondary | ICD-10-CM

## 2018-07-26 NOTE — Telephone Encounter (Signed)
Copied from CRM [id number]. Topic: General - Other >> Jul 25, 2018  3:26 PM Ronney Lion A wrote: Reason for CRM: [facility] in requesting a verbal order for the medication metoprolol succinate (TOPROL-XL) 50 MG 24 hr tablet , from Dr. Carlynn Purl.   He can be reached at [phone]  Refill request was sent to Dr. Alba Cory for approval and submission.

## 2018-07-27 ENCOUNTER — Ambulatory Visit (INDEPENDENT_AMBULATORY_CARE_PROVIDER_SITE_OTHER): Payer: Medicare Other | Admitting: Family Medicine

## 2018-07-27 ENCOUNTER — Encounter: Payer: Self-pay | Admitting: Family Medicine

## 2018-07-27 VITALS — BP 152/82 | HR 68 | Temp 97.6°F | Resp 16 | Ht 65.0 in | Wt 158.4 lb

## 2018-07-27 DIAGNOSIS — M810 Age-related osteoporosis without current pathological fracture: Secondary | ICD-10-CM

## 2018-07-27 DIAGNOSIS — I10 Essential (primary) hypertension: Secondary | ICD-10-CM

## 2018-07-27 DIAGNOSIS — Z23 Encounter for immunization: Secondary | ICD-10-CM

## 2018-07-27 DIAGNOSIS — M797 Fibromyalgia: Secondary | ICD-10-CM

## 2018-07-27 DIAGNOSIS — E785 Hyperlipidemia, unspecified: Secondary | ICD-10-CM

## 2018-07-27 DIAGNOSIS — R739 Hyperglycemia, unspecified: Secondary | ICD-10-CM

## 2018-07-27 DIAGNOSIS — E559 Vitamin D deficiency, unspecified: Secondary | ICD-10-CM

## 2018-07-27 DIAGNOSIS — M170 Bilateral primary osteoarthritis of knee: Secondary | ICD-10-CM

## 2018-07-27 DIAGNOSIS — F5101 Primary insomnia: Secondary | ICD-10-CM

## 2018-07-27 MED ORDER — CARISOPRODOL 350 MG PO TABS: 350.0000 mg | ORAL_TABLET | Freq: Every day | ORAL | 0 refills | Status: DC | PRN

## 2018-07-27 MED ORDER — METOPROLOL SUCCINATE ER 50 MG PO TB24: 50.0000 mg | ORAL_TABLET | Freq: Every day | ORAL | 1 refills | Status: DC

## 2018-07-27 MED ORDER — VALSARTAN-HYDROCHLOROTHIAZIDE 320-12.5 MG PO TABS: 1.0000 | ORAL_TABLET | Freq: Every day | ORAL | 1 refills | Status: DC

## 2018-07-27 MED ORDER — TRAZODONE HCL 100 MG PO TABS: ORAL_TABLET | ORAL | 1 refills | Status: DC

## 2018-07-27 MED ORDER — VITAMIN D (ERGOCALCIFEROL) 1.25 MG (50000 UNIT) PO CAPS: 50000.0000 [IU] | ORAL_CAPSULE | ORAL | 1 refills | Status: DC

## 2018-07-27 NOTE — Progress Notes (Signed)
Name: Alyssa Frazier   MRN: [id number]    DOB: 01/28/1934   Date:07/27/2018       Progress Note  Subjective  Chief Complaint  Chief Complaint  Patient presents with  . Medication Refill  . Hypertension    Denies any symptoms-Refill  . Insomnia  . Fibromyalgia  . Allergic Rhinitis     Only takes Flonase as needed    HPI  HTN: patient is taking medication as prescribed, no chest pain, occasionally has palpitation - usually after drinking caffeine. She gets dizzy when she stoops. BP today is high, but they had to take a detour today and made her feel nervous  Insomnia: she takes Trazodone to sleep and it helps her fall asleep and stay asleep, she has been taking 100 mg qpm and happy with results   FMS: she has daily muscle spasms, trigger points aches but not constantly, occasionally joint pain, denies mental fogginess. She still used biofeedback to control her symptoms. Soma helps but she only takes is seldom   Pre-diabetes: last hgbA1C was 6.0% , no weight gain, no polyphagia, polydipsia or polyuria. We will recheck labs today   AR: she has noticed some post-nasal drip lately, nasal congestion, she has watery eyes aoccasional episodes of sneezing and itching at times.   Hyperlipidemia: we will recheck labs  Patient Active Problem List   Diagnosis Date Noted  . Benign essential HTN 08/14/2015  . Celiac disease 08/14/2015  . Diverticulosis of colon 08/14/2015  . Arthritis, degenerative 08/14/2015  . Dyslipidemia 08/14/2015  . Fibromyalgia 08/14/2015  . Overweight (BMI 25.0-29.9) 08/14/2015  . OP (osteoporosis) 08/14/2015  . HZV (herpes zoster virus) post herpetic neuralgia 08/14/2015  . Perennial allergic rhinitis with seasonal variation 08/14/2015  . Vitamin D deficiency 08/14/2015  . Bursitis, trochanteric 08/14/2015  . Metabolic syndrome 08/14/2015  . Vertigo 08/14/2015    Past Surgical History:  Procedure Laterality Date  . ABDOMINAL HYSTERECTOMY  1975  .  APPENDECTOMY    . removal of ingrown toe nail    . TUBAL LIGATION      Family History  Problem Relation Age of Onset  . Heart disease Mother        CHF  . Heart attack Father   . CAD Father   . Parkinson's disease Sister        Deceased  . Cancer Sister        Breast Cancer  . Heart attack Brother 75    Social History   Socioeconomic History  . Marital status: Widowed    Spouse name: Not on file  . Number of children: 1  . Years of education: Not on file  . Highest education level: Not on file  Occupational History  . Not on file  Social Needs  . Financial resource strain: Not very hard  . Food insecurity:    Worry: Sometimes true    Inability: Never true  . Transportation needs:    Medical: Yes    Non-medical: No  Tobacco Use  . Smoking status: Never Smoker  . Smokeless tobacco: Never Used  Substance and Sexual Activity  . Alcohol use: No    Alcohol/week: 0.0 standard drinks  . Drug use: No  . Sexual activity: Not Currently  Lifestyle  . Physical activity:    Days per week: 0 days    Minutes per session: 0 min  . Stress: Not on file  Relationships  . Social connections:    Talks on phone: Not  on file    Gets together: Not on file    Attends religious service: Not on file    Active member of club or organization: Not on file    Attends meetings of clubs or organizations: Not on file    Relationship status: Not on file  . Intimate partner violence:    Fear of current or ex partner: No    Emotionally abused: No    Physically abused: No    Forced sexual activity: No  Other Topics Concern  . Not on file  Social History Narrative   Son lives next door and her grandson lives with her      Current Outpatient Medications:  .  carboxymethylcellulose (REFRESH TEARS) 0.5 % SOLN, Place 1 drop into both eyes as needed. , Disp: , Rfl:  .  carisoprodol (SOMA) 350 MG tablet, Take 1 tablet (350 mg total) by mouth daily as needed., Disp: 90 tablet, Rfl: 0 .   fluticasone (FLONASE) 50 MCG/ACT nasal spray, USE 2 SPRAYS IN EACH NOSTRIL DAILY (Patient taking differently: Place 1 spray into both nostrils as needed. ), Disp: 48 g, Rfl: 1 .  Liniments (SALONPAS PAIN RELIEF PATCH EX), Apply topically., Disp: , Rfl:  .  metoprolol succinate (TOPROL-XL) 50 MG 24 hr tablet, Take 1 tablet (50 mg total) by mouth daily., Disp: 90 tablet, Rfl: 1 .  traZODone (DESYREL) 100 MG tablet, TAKE 1 TABLET EVERY EVENING, Disp: 90 tablet, Rfl: 1 .  valsartan-hydrochlorothiazide (DIOVAN-HCT) 320-12.5 MG tablet, Take 1 tablet by mouth daily. New dose, Disp: 90 tablet, Rfl: 1 .  Vitamin D, Ergocalciferol, (DRISDOL) 50000 units CAPS capsule, Take 1 capsule (50,000 Units total) by mouth every 7 (seven) days., Disp: 12 capsule, Rfl: 1  Allergies  Allergen Reactions  . Aspirin   . Cefaclor   . Ciprofloxacin   . Simvastatin   . Sulfa Antibiotics     I personally reviewed active problem list, medication list, allergies, family history, social history with the patient/caregiver today.   ROS  Constitutional: Negative for fever or weight change.  Respiratory: Negative for cough and shortness of breath.   Cardiovascular: Negative for chest pain or palpitations.  Gastrointestinal: Negative for abdominal pain, no bowel changes.  Musculoskeletal: Negative for gait problem or joint swelling.  Skin: Negative for rash.  Neurological: Negative for dizziness or headache.  No other specific complaints in a complete review of systems (except as listed in HPI above).  Objective  Vitals:   07/27/18 1106  BP: (!) 152/82  Pulse: 68  Resp: 16  Temp: 97.6 F (36.4 C)  TempSrc: Oral  SpO2: 96%  Weight: 158 lb 6.4 oz (71.8 kg)  Height: 5\' 5"  (1.651 m)    Body mass index is 26.36 kg/m.  Physical Exam  Constitutional: Patient appears well-developed and well-nourished. Overweight.  No distress.  HEENT: head atraumatic, normocephalic, pupils equal and reactive to light, neck  supple, throat within normal limits Cardiovascular: Normal rate, regular rhythm and normal heart sounds.  No murmur heard. No BLE edema. Pulmonary/Chest: Effort normal and breath sounds normal. No respiratory distress. Abdominal: Soft.  There is no tenderness. Muscular Skeletal: trigger point positive  Psychiatric: Patient has a normal mood and affect. behavior is normal. Judgment and thought content normal.  PHQ2/9: Depression screen [facility] 2/9 07/27/2018 07/19/2017 07/21/2016 02/05/2016 08/14/2015  Decreased Interest 0 0 0 0 0  Down, Depressed, Hopeless 0 0 0 0 0  PHQ - 2 Score 0 0 0 0 0  Fall Risk: Fall Risk  07/27/2018 07/19/2017 07/21/2016 02/05/2016 08/14/2015  Falls in the past year? No Yes No Yes Yes  Number falls in past yr: - 1 - 1 1  Injury with Fall? - No - No No  Risk for fall due to : - - - - -     Functional Status Survey: Is the patient deaf or have difficulty hearing?: No Does the patient have difficulty seeing, even when wearing glasses/contacts?: Yes Does the patient have difficulty concentrating, remembering, or making decisions?: No Does the patient have difficulty walking or climbing stairs?: Yes Does the patient have difficulty dressing or bathing?: No Does the patient have difficulty doing errands alone such as visiting a doctor's office or shopping?: Yes(Does not drive)    Assessment & Plan  1. Benign essential HTN  - metoprolol succinate (TOPROL-XL) 50 MG 24 hr tablet; Take 1 tablet (50 mg total) by mouth daily.  Dispense: 90 tablet; Refill: 1 - valsartan-hydrochlorothiazide (DIOVAN-HCT) 320-12.5 MG tablet; Take 1 tablet by mouth daily. New dose  Dispense: 90 tablet; Refill: 1 - CBC with Differential/Platelet - COMPLETE METABOLIC PANEL WITH GFR  2. Need for immunization against influenza  - Flu vaccine HIGH DOSE PF (Fluzone High dose)  3. Fibromyalgia  - carisoprodol (SOMA) 350 MG tablet; Take 1 tablet (350 mg total) by mouth daily as needed.   Dispense: 90 tablet; Refill: 0  4. Vitamin D deficiency  - Vitamin D, Ergocalciferol, (DRISDOL) 50000 units CAPS capsule; Take 1 capsule (50,000 Units total) by mouth every 7 (seven) days.  Dispense: 12 capsule; Refill: 1  5. Dyslipidemia  - Lipid panel  6. Age related osteoporosis, unspecified pathological fracture presence  She refuses having it repeated   7. Hyperglycemia  - Hemoglobin A1c  8. Primary osteoarthritis of both knees   9. Primary insomnia  - traZODone (DESYREL) 100 MG tablet; TAKE 1 TABLET EVERY EVENING  Dispense: 90 tablet; Refill: 1

## 2018-07-28 ENCOUNTER — Telehealth: Payer: Self-pay | Admitting: Family Medicine

## 2018-07-28 NOTE — Telephone Encounter (Unsigned)
Copied from CRM [id number]. Topic: Quick Communication - See Telephone Encounter >> Jul 28, 2018 10:58 AM Floria Raveling A wrote: CRM for notification. See Telephone encounter for: 07/28/18.  [facility] and stated that pt ins is requesting we try "cover my meds" for the carisoprodol (SOMA) 350 MG tablet [[id number]], they would like the pt to try the Tizanidin  [phone] Ref# [id number]

## 2018-07-29 LAB — CBC WITH DIFFERENTIAL/PLATELET
Basophils Absolute: 43 cells/uL (ref 0–200)
Basophils Relative: 0.6 %
Eosinophils Absolute: 121 cells/uL (ref 15–500)
Eosinophils Relative: 1.7 %
HEMATOCRIT: 40.8 % (ref 35.0–45.0)
HEMOGLOBIN: 13.8 g/dL (ref 11.7–15.5)
LYMPHS ABS: 2876 {cells}/uL (ref 850–3900)
MCH: 30.1 pg (ref 27.0–33.0)
MCHC: 33.8 g/dL (ref 32.0–36.0)
MCV: 89.1 fL (ref 80.0–100.0)
MPV: 10.9 fL (ref 7.5–12.5)
Monocytes Relative: 7.2 %
NEUTROS ABS: 3550 {cells}/uL (ref 1500–7800)
Neutrophils Relative %: 50 %
Platelets: 259 10*3/uL (ref 140–400)
RBC: 4.58 10*6/uL (ref 3.80–5.10)
RDW: 13.4 % (ref 11.0–15.0)
Total Lymphocyte: 40.5 %
WBC: 7.1 10*3/uL (ref 3.8–10.8)
WBCMIX: 511 {cells}/uL (ref 200–950)

## 2018-07-29 LAB — COMPLETE METABOLIC PANEL WITH GFR
AG RATIO: 1.2 (calc) (ref 1.0–2.5)
ALBUMIN MSPROF: 4.1 g/dL (ref 3.6–5.1)
ALT: 16 U/L (ref 6–29)
AST: 20 U/L (ref 10–35)
Alkaline phosphatase (APISO): 42 U/L (ref 33–130)
BUN: 11 mg/dL (ref 7–25)
CALCIUM: 10.1 mg/dL (ref 8.6–10.4)
CO2: 37 mmol/L — AB (ref 20–32)
CREATININE: 0.73 mg/dL (ref 0.60–0.88)
Chloride: 95 mmol/L — ABNORMAL LOW (ref 98–110)
GFR, EST AFRICAN AMERICAN: 89 mL/min/{1.73_m2} (ref >=60)
GFR, Est Non African American: 77 mL/min/{1.73_m2} (ref >=60)
Globulin: 3.3 g/dL (calc) (ref 1.9–3.7)
Glucose, Bld: 97 mg/dL (ref 65–139)
POTASSIUM: 3.5 mmol/L (ref 3.5–5.3)
SODIUM: 137 mmol/L (ref 135–146)
TOTAL PROTEIN: 7.4 g/dL (ref 6.1–8.1)
Total Bilirubin: 0.5 mg/dL (ref 0.2–1.2)

## 2018-07-29 LAB — HEMOGLOBIN A1C
EAG (MMOL/L): 6.6 (calc)
Hgb A1c MFr Bld: 5.8 % of total Hgb — ABNORMAL HIGH (ref <5.7)
Mean Plasma Glucose: 120 (calc)

## 2018-07-29 LAB — LIPID PANEL
CHOL/HDL RATIO: 3.8 (calc) (ref <5.0)
Cholesterol: 218 mg/dL — ABNORMAL HIGH (ref <200)
HDL: 57 mg/dL (ref >50)
LDL Cholesterol (Calc): 132 mg/dL (calc) — ABNORMAL HIGH
NON-HDL CHOLESTEROL (CALC): 161 mg/dL — AB (ref <130)
Triglycerides: 158 mg/dL — ABNORMAL HIGH (ref <150)

## 2018-07-30 NOTE — Telephone Encounter (Signed)
Please ask patient if she agrees on switching to Tizanididine or she would like to try coverrmymeds?

## 2018-07-31 ENCOUNTER — Telehealth: Payer: Self-pay

## 2018-07-31 NOTE — Telephone Encounter (Signed)
Copied from CRM [id number]. Topic: Quick Communication - See Telephone Encounter >> Jul 28, 2018 10:58 AM Floria Raveling A wrote: CRM for notification. See Telephone encounter for: 07/28/18.  [facility] and stated that pt ins is requesting we try "cover my meds" for the carisoprodol (SOMA) 350 MG tablet [[id number]], they would like the pt to try the Tizanidin  [phone] Ref# [id number] >> Jul 31, 2018  1:00 PM Percival Spanish wrote:   Marton Redwood call to follow up if pt is going to be given Tizanidine  Comes in tab or capsules  Please review and decided on how to proceed

## 2018-07-31 NOTE — Telephone Encounter (Signed)
Please call patient to find out her preference, we can send tabs if she would like to try tizanidine

## 2018-08-01 NOTE — Telephone Encounter (Signed)
I spoke with this patient's grandson, Whitney Post, (since she was in the bathroom) and informed him why I was calling. He then relayed the information to her and she agreed to switching to the Tizanidine as requested by her insurance company.  Please send rx (tablets[facility]

## 2018-08-02 MED ORDER — TIZANIDINE HCL 2 MG PO CAPS: 2.0000 mg | ORAL_CAPSULE | Freq: Three times a day (TID) | ORAL | 0 refills | Status: DC

## 2018-08-07 NOTE — Telephone Encounter (Signed)
Left a message for patient to call us back regarding muscle relaxer medication and Insurance preferring Tizanidine over [place].

## 2018-08-10 ENCOUNTER — Other Ambulatory Visit: Payer: Self-pay | Admitting: Family Medicine

## 2018-10-04 ENCOUNTER — Other Ambulatory Visit: Payer: Self-pay | Admitting: Family Medicine

## 2018-10-04 DIAGNOSIS — I10 Essential (primary) hypertension: Secondary | ICD-10-CM

## 2018-10-04 DIAGNOSIS — E559 Vitamin D deficiency, unspecified: Secondary | ICD-10-CM

## 2018-10-04 DIAGNOSIS — F5101 Primary insomnia: Secondary | ICD-10-CM

## 2018-10-04 MED ORDER — METOPROLOL SUCCINATE ER 50 MG PO TB24: 50.0000 mg | ORAL_TABLET | Freq: Every day | ORAL | 1 refills | Status: DC

## 2018-10-04 MED ORDER — VALSARTAN-HYDROCHLOROTHIAZIDE 320-12.5 MG PO TABS: 1.0000 | ORAL_TABLET | Freq: Every day | ORAL | 1 refills | Status: DC

## 2018-10-04 MED ORDER — TRAZODONE HCL 100 MG PO TABS: ORAL_TABLET | ORAL | 1 refills | Status: DC

## 2018-10-04 MED ORDER — VITAMIN D (ERGOCALCIFEROL) 1.25 MG (50000 UNIT) PO CAPS: 50000.0000 [IU] | ORAL_CAPSULE | ORAL | 1 refills | Status: DC

## 2018-10-04 NOTE — Telephone Encounter (Signed)
Copied from CRM [id number]. Topic: Quick Communication - Rx Refill/Question >> Oct 04, 2018  3:59 PM Raoul PitchWilliams-Neal, Sade R wrote: Medication: valsartan-hydrochlorothiazide (DIOVAN-HCT) 320-12.5 MG tablet , Vitamin D, Ergocalciferol, (DRISDOL) 50000 units CAPS capsule, metoprolol succinate (TOPROL-XL) 50 MG 24 hr tablet , traZODone (DESYREL) 100 MG tablet  Has the patient contacted their pharmacy?Yes Preferred Pharmacy (with phone number or street name): [facility] [place] [address] [phone] (Phone) [phone])    Agent: Please be advised that RX refills may take up to 3 business days. We ask that you follow-up with your pharmacy.

## 2018-10-05 ENCOUNTER — Other Ambulatory Visit: Payer: Self-pay | Admitting: Family Medicine

## 2018-10-05 DIAGNOSIS — I10 Essential (primary) hypertension: Secondary | ICD-10-CM

## 2018-10-13 ENCOUNTER — Other Ambulatory Visit: Payer: Self-pay | Admitting: Family Medicine

## 2018-10-13 DIAGNOSIS — I10 Essential (primary) hypertension: Secondary | ICD-10-CM

## 2018-10-31 ENCOUNTER — Ambulatory Visit: Payer: Self-pay | Admitting: Pharmacist

## 2018-10-31 NOTE — Chronic Care Management (AMB) (Signed)
  Chronic Care Management   Note  10/31/2018 Name: Alyssa Frazier MRN: [id number] DOB: 1934/05/12   85 y.o. year old female referred to Chronic Care Management by Dr. Carlynn Purl for HTN, HLD, assistance needed with transportation. Last office visit with Alba Cory, MD was 07/27/18. Patient consented to [facility] services 07/20/18 but never scheduled a CCM office visit   Was unable to reach patient via telephone today and have left HIPAA compliant voicemail asking patient to return my call. (unsuccessful outreach #1).   Follow Up Plan: Will follow-up within 3-5  business days via telephone.  Karalee Height, PharmD Clinical Pharmacist [facility] [phone]

## 2018-11-03 ENCOUNTER — Ambulatory Visit: Payer: Self-pay

## 2018-11-03 DIAGNOSIS — I10 Essential (primary) hypertension: Secondary | ICD-10-CM

## 2018-11-03 DIAGNOSIS — E8881 Metabolic syndrome: Secondary | ICD-10-CM

## 2018-11-03 DIAGNOSIS — E785 Hyperlipidemia, unspecified: Secondary | ICD-10-CM

## 2018-11-03 NOTE — Chronic Care Management (AMB) (Signed)
  Chronic Care Management   Note  10/31/2018 Name: Alyssa Frazier     MRN: [id number]       DOB: 1934/04/08   85 y.o. year old female referred to Chronic Care Management by Dr. Carlynn Purl for HTN, HLD, assistance needed with transportation. Last office visit with Alba Cory, MD was 07/27/18. Patient consented to [facility] services 07/20/18 but never scheduled a CCM office visit   Was unable to reach patient via telephone today and have left HIPAA compliant voicemail asking patient to return my call. (unsuccessful outreach #2).   Follow Up Plan: Will follow-up within 3-5  business days via telephone.    Nga Rabon E. Suzie Portela, RN, BSN Nurse Care Coordinator [facility] Care Management  [phone]

## 2018-11-06 ENCOUNTER — Other Ambulatory Visit: Payer: Self-pay | Admitting: Family Medicine

## 2018-11-06 DIAGNOSIS — I10 Essential (primary) hypertension: Secondary | ICD-10-CM

## 2018-11-07 ENCOUNTER — Other Ambulatory Visit: Payer: Self-pay | Admitting: Family Medicine

## 2018-11-07 ENCOUNTER — Ambulatory Visit: Payer: Self-pay | Admitting: Pharmacist

## 2018-11-07 DIAGNOSIS — I10 Essential (primary) hypertension: Secondary | ICD-10-CM

## 2018-11-07 DIAGNOSIS — E8881 Metabolic syndrome: Secondary | ICD-10-CM

## 2018-11-07 DIAGNOSIS — E785 Hyperlipidemia, unspecified: Secondary | ICD-10-CM

## 2018-11-07 NOTE — Chronic Care Management (AMB) (Signed)
  Chronic Care Management   Note  11/07/2018 Name: Alyssa Frazier Chern MRN: [id number] DOB: 10/25/1933  Alyssa Frazier Tuma is a 85 y.o. year old female who sees Alba CorySowles, Krichna, MD for primary care. Dr. Carlynn PurlSowles asked the CCM team to consult the patient for assistance with chronic disease management related to transportation.   Successful outreach to patient today. Patient requested I speak with grandson Paediatric nurseLogan (DPR on file) due to difficulty hearing. HIPAA identifiers verified.   Plan: Patient agreed to services and verbal consent obtained 07/20/18 but patient never scheduled a CCM office appointment. Patient's grandson Whitney PostLogan (DPR on file) requests that CCM team introduce services and have introductory appointment in April when patient is in for follow up as transportation is a barrier.   Follow up April 21   Karalee HeightJulie Krrish Freund, PharmD Clinical Pharmacist [facility] [phone]

## 2018-12-15 ENCOUNTER — Other Ambulatory Visit: Payer: Self-pay | Admitting: Family Medicine

## 2018-12-15 DIAGNOSIS — I10 Essential (primary) hypertension: Secondary | ICD-10-CM

## 2018-12-15 NOTE — Telephone Encounter (Signed)
Copied from CRM [id number]. Topic: Quick Communication - Rx Refill/Question >> Dec 15, 2018  3:49 PM [place], [place] D wrote: Medication: metoprolol succinate (TOPROL-XL) 50 MG 24 hr tablet / Pt stated she is out of medication. The last time she picked up a refill from [facility], she only received 14 tablets because she was supposed to receive medication through her mail order, which she has not. Please send refill to [facility], expedited preferably.  Has the patient contacted their pharmacy? Yes.   (Agent: If no, request that the patient contact the pharmacy for the refill.) (Agent: If yes, when and what did the pharmacy advise?)  Preferred Pharmacy (with phone number or street name): [facility] - [place], [place] - [address] [phone] (Phone) [phone] (Fax)    Agent: Please be advised that RX refills may take up to 3 business days. We ask that you follow-up with your pharmacy.

## 2018-12-18 MED ORDER — METOPROLOL SUCCINATE ER 50 MG PO TB24: 50.0000 mg | ORAL_TABLET | Freq: Every day | ORAL | 0 refills | Status: DC

## 2018-12-20 ENCOUNTER — Telehealth: Payer: Self-pay | Admitting: Family Medicine

## 2018-12-20 DIAGNOSIS — I10 Essential (primary) hypertension: Secondary | ICD-10-CM

## 2018-12-20 NOTE — Telephone Encounter (Signed)
Copied from CRM [id number]. Topic: General - Other >> Dec 20, 2018  4:50 PM Tamela Oddi wrote: Reason for CRM: Patient called to request that the nurse review her medication list and give her a call because patient said she will run out of medication before her appointment on 02/06/2019.  Please advise and call patient back at [phone]

## 2018-12-21 MED ORDER — TIZANIDINE HCL 2 MG PO CAPS: 2.0000 mg | ORAL_CAPSULE | Freq: Three times a day (TID) | ORAL | 0 refills | Status: DC

## 2018-12-21 MED ORDER — METOPROLOL SUCCINATE ER 50 MG PO TB24: 50.0000 mg | ORAL_TABLET | Freq: Every day | ORAL | 0 refills | Status: DC

## 2018-12-21 MED ORDER — VALSARTAN-HYDROCHLOROTHIAZIDE 320-12.5 MG PO TABS: 1.0000 | ORAL_TABLET | Freq: Every day | ORAL | 1 refills | Status: DC

## 2018-12-21 NOTE — Telephone Encounter (Signed)
Hypertension medication request: Valsartan-HCTZ, Metoprolol and Tizanidine to [facility].   Last office visit pertaining to hypertension: 07/28/2019   BP Readings from Last 3 Encounters:  07/27/18 (!) 152/82  07/19/17 118/64  07/21/16 138/74    Lab Results  Component Value Date   CREATININE 0.73 07/27/2018   BUN 11 07/27/2018   NA 137 07/27/2018   K 3.5 07/27/2018   CL 95 (L) 07/27/2018   CO2 37 (H) 07/27/2018     Follow up on 02/06/2019

## 2018-12-27 NOTE — Telephone Encounter (Signed)
Patient is checking on the status of her valsartan-hydrochlorothiazide (DIOVAN-HCT) 320-12.5 MG tablet medication request for a refill. Patient has two tablets left.

## 2018-12-28 NOTE — Telephone Encounter (Signed)
Called patient. No answer. Left VM. [facility] 12[facility]21/[facility]. Please call [facility] for EDT. If it want be delivered by the time you are out of medication please call back and we will send few tablets to your local pharmacy until you medication arrives.

## 2019-01-01 MED ORDER — VALSARTAN-HYDROCHLOROTHIAZIDE 320-12.5 MG PO TABS: 1.0000 | ORAL_TABLET | Freq: Every day | ORAL | 0 refills | Status: DC

## 2019-01-01 NOTE — Telephone Encounter (Signed)
Pt grandson, Whitney Post, called stating that he would like to get a 90 day supply sent to [facility] due to processing issues at this time with [facility]. There is a discrepancy with the pts bank card information and billing medications to her. Please advise.   [facility] - [place], [place] - [address] [phone] (Phone) [phone] (Fax)

## 2019-02-06 ENCOUNTER — Ambulatory Visit: Payer: Self-pay

## 2019-02-06 ENCOUNTER — Other Ambulatory Visit: Payer: Self-pay

## 2019-02-06 ENCOUNTER — Encounter: Payer: Self-pay | Admitting: Family Medicine

## 2019-02-06 ENCOUNTER — Ambulatory Visit: Payer: Medicare Other

## 2019-02-06 ENCOUNTER — Ambulatory Visit (INDEPENDENT_AMBULATORY_CARE_PROVIDER_SITE_OTHER): Payer: Medicare Other | Admitting: Family Medicine

## 2019-02-06 DIAGNOSIS — I10 Essential (primary) hypertension: Secondary | ICD-10-CM

## 2019-02-06 DIAGNOSIS — E8881 Metabolic syndrome: Secondary | ICD-10-CM

## 2019-02-06 DIAGNOSIS — M810 Age-related osteoporosis without current pathological fracture: Secondary | ICD-10-CM

## 2019-02-06 DIAGNOSIS — F5101 Primary insomnia: Secondary | ICD-10-CM

## 2019-02-06 DIAGNOSIS — E559 Vitamin D deficiency, unspecified: Secondary | ICD-10-CM

## 2019-02-06 DIAGNOSIS — M797 Fibromyalgia: Secondary | ICD-10-CM

## 2019-02-06 DIAGNOSIS — E785 Hyperlipidemia, unspecified: Secondary | ICD-10-CM

## 2019-02-06 MED ORDER — METOPROLOL SUCCINATE ER 50 MG PO TB24: 50.0000 mg | ORAL_TABLET | Freq: Every day | ORAL | 0 refills | Status: DC

## 2019-02-06 MED ORDER — TRAZODONE HCL 100 MG PO TABS: ORAL_TABLET | ORAL | 0 refills | Status: AC

## 2019-02-06 MED ORDER — VALSARTAN-HYDROCHLOROTHIAZIDE 320-12.5 MG PO TABS: 1.0000 | ORAL_TABLET | Freq: Every day | ORAL | 0 refills | Status: AC

## 2019-02-06 MED ORDER — VITAMIN D 50 MCG (2000 UT) PO CAPS: 1.0000 | ORAL_CAPSULE | Freq: Every day | ORAL | 0 refills | Status: AC

## 2019-02-06 MED ORDER — TIZANIDINE HCL 2 MG PO CAPS: 2.0000 mg | ORAL_CAPSULE | Freq: Three times a day (TID) | ORAL | 0 refills | Status: AC

## 2019-02-06 NOTE — Chronic Care Management (AMB) (Signed)
  Chronic Care Management   Note  02/06/2019 Name: Alyssa Frazier MRN: [id number] DOB: Jul 19, 1934  Care Coordination: Patient was previously scheduled for CCM Face to Face appointment with RN CM and pharmacist today at 3:30. Unsuccessful telephone outreach to patient do discuss completion of appointment via telephone but unfortunately CCM RN CM was unable to leave message. [facility] office staff notified to contact CCM RN CM if patient should arrive.  Plan: Will follow up with patient/son to reschedule once Covid-19 restrictions are lifted.    Licia Harl E. Suzie Portela, RN, BSN Nurse Care Coordinator [facility] Care Management  [phone]

## 2019-02-06 NOTE — Progress Notes (Signed)
Name: Alyssa Frazier   MRN: [id number]    DOB: 07/20/1934   Date:02/06/2019       Progress Note  Subjective  Chief Complaint  Chief Complaint  Patient presents with  . Medication Refill  . Hypertension    Denies any symptoms  . Insomnia  . Allergic Rhinitis   . Fibromyalgia  . Hyperlipidemia    I connected with  Alyssa CardMarian W Frazier on 02/06/19 at  2:20 PM EDT by telephone and verified that I am speaking with the correct person using two identifiers.  I discussed the limitations, risks, security and privacy concerns of performing an evaluation and management service by telephone and the availability of in person appointments. Staff also discussed with the patient that there may be a patient responsible charge related to this service. Patient Location: at home Provider [facility]   HPI    HTN: patient is taking medication as prescribed, no chest pain, occasionally has palpitation - usually after drinking caffeine.Last visit bp was elevated, but usually at goal   Insomnia: she takes Trazodone to sleep and it helps her fall asleep and stay asleep, she has been taking 100 mg qpm and she is able to sleep 7-7:30  hours per night.    FMS: she has daily muscle spasms, trigger points achesbut not constantly,occasionally joint pain, denies mental fogginess. She still used biofeedback to control her symptoms. Tresa GarterSoma helps but she only takes is seldom She meditates every morning   Pre-diabetes: last hgbA1C was 5.8% with some improvement , no weight gain, no polyphagia, polydipsia or polyuria.  AR: she has noticed some post-nasal drip lately, nasal congestion, she has watery eyes aoccasional episodes of sneezing and itching at times. She states it is worse this time of the year   Hyperlipidemia: last LDL was 132, triglycerides stable, HDL good. Continue healthy diet   Lives with her grown grandson, her son stops by every morning and cooks her breakfast   Patient Active Problem List   Diagnosis Date Noted  . Benign essential HTN 08/14/2015  . Celiac disease 08/14/2015  . Diverticulosis of colon 08/14/2015  . Arthritis, degenerative 08/14/2015  . Dyslipidemia 08/14/2015  . Fibromyalgia 08/14/2015  . Overweight (BMI 25.0-29.9) 08/14/2015  . OP (osteoporosis) 08/14/2015  . HZV (herpes zoster virus) post herpetic neuralgia 08/14/2015  . Perennial allergic rhinitis with seasonal variation 08/14/2015  . Vitamin D deficiency 08/14/2015  . Bursitis, trochanteric 08/14/2015  . Metabolic syndrome 08/14/2015  . Vertigo 08/14/2015    Past Surgical History:  Procedure Laterality Date  . ABDOMINAL HYSTERECTOMY  1975  . APPENDECTOMY    . removal of ingrown toe nail    . TUBAL LIGATION      Family History  Problem Relation Age of Onset  . Heart disease Mother        CHF  . Heart attack Father   . CAD Father   . Parkinson's disease Sister   . Heart attack Brother 55  . Breast cancer Sister     Social History   Socioeconomic History  . Marital status: Widowed    Spouse name: Not on file  . Number of children: 1  . Years of education: Not on file  . Highest education level: Not on file  Occupational History  . Not on file  Social Needs  . Financial resource strain: Not very hard  . Food insecurity:    Worry: Sometimes true    Inability: Never true  . Transportation  needs:    Medical: Yes    Non-medical: No  Tobacco Use  . Smoking status: Never Smoker  . Smokeless tobacco: Never Used  Substance and Sexual Activity  . Alcohol use: No    Alcohol/week: 0.0 standard drinks  . Drug use: No  . Sexual activity: Not Currently  Lifestyle  . Physical activity:    Days per week: 0 days    Minutes per session: 0 min  . Stress: Not on file  Relationships  . Social connections:    Talks on phone: Not on file    Gets together: Not on file    Attends religious service: Not on file    Active member of club or organization: Not on  file    Attends meetings of clubs or organizations: Not on file    Relationship status: Not on file  . Intimate partner violence:    Fear of current or ex partner: No    Emotionally abused: No    Physically abused: No    Forced sexual activity: No  Other Topics Concern  . Not on file  Social History Narrative   Son lives next door and her grandson lives with her    Ms. Aslinger and her twin brother were both Valedictorian to their HS   She went to [employer]      Current Outpatient Medications:  .  carboxymethylcellulose (REFRESH TEARS) 0.5 % SOLN, Place 1 drop into both eyes as needed. , Disp: , Rfl:  .  Liniments (SALONPAS PAIN RELIEF PATCH EX), Apply topically., Disp: , Rfl:  .  metoprolol succinate (TOPROL-XL) 50 MG 24 hr tablet, Take 1 tablet (50 mg total) by mouth daily. Take with or immediately following a meal., Disp: 90 tablet, Rfl: 0 .  tizanidine (ZANAFLEX) 2 MG capsule, Take 1 capsule (2 mg total) by mouth 3 (three) times daily., Disp: 90 capsule, Rfl: 0 .  traZODone (DESYREL) 100 MG tablet, TAKE 1 TABLET EVERY EVENING, Disp: 90 tablet, Rfl: 1 .  valsartan-hydrochlorothiazide (DIOVAN-HCT) 320-12.5 MG tablet, Take 1 tablet by mouth daily. New dose, Disp: 90 tablet, Rfl: 0 .  fluticasone (FLONASE) 50 MCG/ACT nasal spray, USE 2 SPRAYS IN EACH NOSTRIL DAILY (Patient not taking: No sig reported), Disp: 48 g, Rfl: 1 .  Vitamin D, Ergocalciferol, (DRISDOL) 1.25 MG (50000 UT) CAPS capsule, Take 1 capsule (50,000 Units total) by mouth every 7 (seven) days. (Patient not taking: Reported on 02/06/2019), Disp: 12 capsule, Rfl: 1  Allergies  Allergen Reactions  . Aspirin   . Cefaclor   . Ciprofloxacin   . Simvastatin   . Sulfa Antibiotics     I personally reviewed active problem list, medication list, allergies, family history, social history with the patient/caregiver today.   ROS  Ten systems reviewed and is negative except as mentioned in HPI   Objective  Virtual encounter,  vitals not obtained.   Physical Exam  Awake, alert and oriented    PHQ2/9: Depression screen [facility] 2/9 02/06/2019 07/27/2018 07/19/2017 07/21/2016 02/05/2016  Decreased Interest 0 0 0 0 0  Down, Depressed, Hopeless 0 0 0 0 0  PHQ - 2 Score 0 0 0 0 0  Altered sleeping 0 - - - -  Tired, decreased energy 0 - - - -  Change in appetite 0 - - - -  Feeling bad or failure about yourself  0 - - - -  Trouble concentrating 0 - - - -  Moving slowly or fidgety/restless 0 - - - -  Suicidal thoughts 0 - - - -  PHQ-9 Score 0 - - - -  Difficult doing work/chores Not difficult at all - - - -   PHQ-2/9 Result is negative.    Fall Risk: Fall Risk  02/06/2019 07/27/2018 07/19/2017 07/21/2016 02/05/2016  Falls in the past year? 0 No Yes No Yes  Number falls in past yr: 0 - 1 - 1  Injury with Fall? 0 - No - No  Risk for fall due to : - - - - -     Assessment & Plan  1. Benign essential HTN  - valsartan-hydrochlorothiazide (DIOVAN-HCT) 320-12.5 MG tablet; Take 1 tablet by mouth daily. New dose  Dispense: 90 tablet; Refill: 0 - metoprolol succinate (TOPROL-XL) 50 MG 24 hr tablet; Take 1 tablet (50 mg total) by mouth daily. Take with or immediately following a meal.  Dispense: 90 tablet; Refill: 0  2. Metabolic syndrome  Discussed life style modification  3. Dyslipidemia  On healthy diet only  4. Fibromyalgia  - tizanidine (ZANAFLEX) 2 MG capsule; Take 1 capsule (2 mg total) by mouth 3 (three) times daily.  Dispense: 90 capsule; Refill: 0  5. Primary insomnia  - traZODone (DESYREL) 100 MG tablet; TAKE 1 TABLET EVERY EVENING  Dispense: 90 tablet; Refill: 0  6. Age related osteoporosis, unspecified pathological fracture presence   7. Vitamin D deficiency  - Cholecalciferol (VITAMIN D) 50 MCG (2000 UT) CAPS; Take 1 capsule (2,000 Units total) by mouth daily.  Dispense: 30 capsule; Refill: 0  I discussed the assessment and treatment plan with the patient. The patient was provided an  opportunity to ask questions and all were answered. The patient agreed with the plan and demonstrated an understanding of the instructions.   The patient was advised to call back or seek an in-person evaluation if the symptoms worsen or if the condition fails to improve as anticipated.  I provided 25 minutes of non-face-to-face time during this encounter.  Ruel Favors, MD

## 2019-05-21 ENCOUNTER — Other Ambulatory Visit: Payer: Self-pay | Admitting: Family Medicine

## 2019-05-21 DIAGNOSIS — I10 Essential (primary) hypertension: Secondary | ICD-10-CM

## 2019-05-21 MED ORDER — METOPROLOL SUCCINATE ER 50 MG PO TB24: 50.0000 mg | ORAL_TABLET | Freq: Every day | ORAL | 0 refills | Status: AC

## 2019-05-21 NOTE — Telephone Encounter (Signed)
Medication Refill - Medication:  metoprolol succinate (TOPROL-XL) 50 MG 24 hr tablet  Has the patient contacted their pharmacy? Pharmacy called to make request.  Preferred Pharmacy (with phone number or street name):  [place], [place] - [place] [phone] (Phone) [phone] (Fax)   Agent: Please be advised that RX refills may take up to 3 business days. We ask that you follow-up with your pharmacy.

## 2019-05-21 NOTE — Telephone Encounter (Signed)
Hypertension medication request: Metoprolol to [facility]    Last office visit pertaining to hypertension: 02/06/2019    BP Readings from Last 3 Encounters:  07/27/18 (!) 152/82  07/19/17 118/64  07/21/16 138/74    Lab Results  Component Value Date   CREATININE 0.73 07/27/2018   BUN 11 07/27/2018   NA 137 07/27/2018   K 3.5 07/27/2018   CL 95 (L) 07/27/2018   CO2 37 (H) 07/27/2018    Follow up on 05/24/2019

## 2019-05-24 ENCOUNTER — Ambulatory Visit: Payer: Self-pay

## 2019-05-24 ENCOUNTER — Other Ambulatory Visit: Payer: Self-pay

## 2019-06-08 ENCOUNTER — Telehealth: Payer: Self-pay

## 2019-06-08 NOTE — Telephone Encounter (Signed)
Copied from [place] [phone]. Topic: General - Other >> Jun 07, 2019  2:48 PM Antonieta Iba C wrote: Reason for CRM: Verdis Prime with [facility] in [place], [place] would like to know if PCP would sign pt's death certificate and how would they like to go about handling?   Please advise/assist  CB: [phone]

## 2019-06-11 NOTE — Telephone Encounter (Signed)
Spoke with JR with [facility] in [place], [place] and inform him Dr. Ancil Boozer would sign the patient's death certificate to just bring it to our office.

## 2019-06-19 DEATH — deceased
